# Patient Record
Sex: Female | Born: 1940 | Race: White | Hispanic: No | Marital: Married | Smoking: Never smoker
Health system: Southern US, Community
[De-identification: ages and names within clinical notes are randomized; demographics above are authoritative.]

## PROBLEM LIST (undated history)

## (undated) DIAGNOSIS — I2510 Atherosclerotic heart disease of native coronary artery without angina pectoris: Secondary | ICD-10-CM

## (undated) DIAGNOSIS — I4891 Unspecified atrial fibrillation: Secondary | ICD-10-CM

## (undated) DIAGNOSIS — R011 Cardiac murmur, unspecified: Secondary | ICD-10-CM

## (undated) DIAGNOSIS — J45909 Unspecified asthma, uncomplicated: Secondary | ICD-10-CM

## (undated) DIAGNOSIS — R2681 Unsteadiness on feet: Secondary | ICD-10-CM

## (undated) DIAGNOSIS — L309 Dermatitis, unspecified: Secondary | ICD-10-CM

## (undated) HISTORY — DX: Atherosclerotic heart disease of native coronary artery without angina pectoris: I25.10

## (undated) HISTORY — DX: Unspecified atrial fibrillation: I48.91

## (undated) HISTORY — DX: Dermatitis, unspecified: L30.9

## (undated) HISTORY — DX: Unsteadiness on feet: R26.81

## (undated) HISTORY — PX: OTHER SURGICAL HISTORY: SHX169

## (undated) HISTORY — PX: ABDOMINAL HYSTERECTOMY: SHX81

## (undated) HISTORY — DX: Unspecified asthma, uncomplicated: J45.909

## (undated) HISTORY — PX: RECTOCELE REPAIR: SHX761

## (undated) HISTORY — DX: Cardiac murmur, unspecified: R01.1

---

## 2001-02-01 ENCOUNTER — Other Ambulatory Visit: Admission: RE | Admit: 2001-02-01 | Discharge: 2001-02-01 | Payer: Self-pay | Admitting: Radiology

## 2001-10-11 DIAGNOSIS — R011 Cardiac murmur, unspecified: Secondary | ICD-10-CM

## 2001-10-11 HISTORY — DX: Cardiac murmur, unspecified: R01.1

## 2009-02-03 ENCOUNTER — Encounter: Admission: RE | Admit: 2009-02-03 | Discharge: 2009-02-03 | Payer: Self-pay | Admitting: Orthopedic Surgery

## 2012-11-11 ENCOUNTER — Encounter: Payer: Self-pay | Admitting: Pharmacist Clinician (PhC)/ Clinical Pharmacy Specialist

## 2012-11-11 DIAGNOSIS — Z7901 Long term (current) use of anticoagulants: Secondary | ICD-10-CM | POA: Insufficient documentation

## 2012-11-11 DIAGNOSIS — I4891 Unspecified atrial fibrillation: Secondary | ICD-10-CM

## 2012-11-21 ENCOUNTER — Other Ambulatory Visit (HOSPITAL_COMMUNITY): Payer: Self-pay | Admitting: Cardiovascular Disease

## 2012-11-21 DIAGNOSIS — R0989 Other specified symptoms and signs involving the circulatory and respiratory systems: Secondary | ICD-10-CM

## 2012-12-07 ENCOUNTER — Telehealth: Payer: Self-pay | Admitting: Cardiovascular Disease

## 2012-12-07 NOTE — Telephone Encounter (Signed)
Need refill on Ferrex 150mg  #30-call to [phone]!

## 2012-12-07 NOTE — Telephone Encounter (Signed)
LEFT MESSAGE ON PHARMACY REFILL LINE OKAY TO REFILL FERREX 6 TIMES.

## 2012-12-21 ENCOUNTER — Ambulatory Visit: Payer: Self-pay | Admitting: Pharmacist Clinician (PhC)/ Clinical Pharmacy Specialist

## 2012-12-21 ENCOUNTER — Ambulatory Visit (HOSPITAL_COMMUNITY)
Admission: RE | Admit: 2012-12-21 | Discharge: 2012-12-21 | Disposition: A | Discharge status: Home / Self Care | Payer: Medicare Other | Source: Ambulatory Visit | Attending: Cardiovascular Disease | Admitting: Cardiovascular Disease

## 2012-12-21 ENCOUNTER — Ambulatory Visit (INDEPENDENT_AMBULATORY_CARE_PROVIDER_SITE_OTHER): Payer: Medicare Other | Admitting: Pharmacist Clinician (PhC)/ Clinical Pharmacy Specialist

## 2012-12-21 DIAGNOSIS — I2510 Atherosclerotic heart disease of native coronary artery without angina pectoris: Secondary | ICD-10-CM | POA: Insufficient documentation

## 2012-12-21 DIAGNOSIS — R0989 Other specified symptoms and signs involving the circulatory and respiratory systems: Secondary | ICD-10-CM | POA: Insufficient documentation

## 2012-12-21 DIAGNOSIS — I4891 Unspecified atrial fibrillation: Secondary | ICD-10-CM

## 2012-12-21 DIAGNOSIS — Z7901 Long term (current) use of anticoagulants: Secondary | ICD-10-CM

## 2012-12-21 HISTORY — DX: Atherosclerotic heart disease of native coronary artery without angina pectoris: I25.10

## 2012-12-21 LAB — POCT INR: INR: 2.7

## 2012-12-21 NOTE — Progress Notes (Signed)
Carotid Duplex Completed. °Brianna L Mazza ° °

## 2013-01-23 ENCOUNTER — Ambulatory Visit (INDEPENDENT_AMBULATORY_CARE_PROVIDER_SITE_OTHER): Payer: Medicare Other | Admitting: Pharmacist Clinician (PhC)/ Clinical Pharmacy Specialist

## 2013-01-23 VITALS — BP 132/80 | HR 60

## 2013-01-23 DIAGNOSIS — I4891 Unspecified atrial fibrillation: Secondary | ICD-10-CM

## 2013-01-23 DIAGNOSIS — Z7901 Long term (current) use of anticoagulants: Secondary | ICD-10-CM

## 2013-01-23 LAB — POCT INR: INR: 3.4

## 2013-01-25 ENCOUNTER — Ambulatory Visit: Payer: Medicare Other | Admitting: Pharmacist Clinician (PhC)/ Clinical Pharmacy Specialist

## 2013-01-31 ENCOUNTER — Telehealth: Payer: Self-pay | Admitting: Cardiovascular Disease

## 2013-01-31 NOTE — Telephone Encounter (Signed)
Julie Kemp would like to speak with you about her coumadin .Marland Kitchen   Thanks

## 2013-01-31 NOTE — Telephone Encounter (Signed)
Had dental surgery yesterday, needed clarification on when to restart warfarin.  Advised pt to restart at normal dose today.

## 2013-02-15 ENCOUNTER — Ambulatory Visit (INDEPENDENT_AMBULATORY_CARE_PROVIDER_SITE_OTHER): Payer: Medicare Other | Admitting: Pharmacist Clinician (PhC)/ Clinical Pharmacy Specialist

## 2013-02-15 VITALS — BP 120/72 | HR 56

## 2013-02-15 DIAGNOSIS — Z7901 Long term (current) use of anticoagulants: Secondary | ICD-10-CM

## 2013-02-15 DIAGNOSIS — I4891 Unspecified atrial fibrillation: Secondary | ICD-10-CM

## 2013-02-15 LAB — POCT INR: INR: 3.1

## 2013-02-23 ENCOUNTER — Telehealth: Payer: Self-pay | Admitting: Cardiovascular Disease

## 2013-02-23 MED ORDER — SIMVASTATIN 40 MG PO TABS: 40.0000 mg | ORAL_TABLET | Freq: Every day | ORAL | Status: DC

## 2013-02-23 NOTE — Telephone Encounter (Signed)
Sent a refill request for Simvastatin 40mg  for a 90 day supply that was faxed over on 07/30 and 07/31 and they have not heard anything .Marland Kitchen Please call    Thanks

## 2013-03-01 ENCOUNTER — Other Ambulatory Visit: Payer: Self-pay | Admitting: *Deleted

## 2013-03-01 DIAGNOSIS — R5381 Other malaise: Secondary | ICD-10-CM

## 2013-03-01 DIAGNOSIS — R011 Cardiac murmur, unspecified: Secondary | ICD-10-CM

## 2013-03-01 DIAGNOSIS — R5383 Other fatigue: Secondary | ICD-10-CM

## 2013-03-01 DIAGNOSIS — R6889 Other general symptoms and signs: Secondary | ICD-10-CM

## 2013-03-01 LAB — CBC WITH DIFFERENTIAL/PLATELET
Basophils Relative: 0 % (ref 0–1)
Eosinophils Absolute: 0.1 10*3/uL (ref 0.0–0.7)
Hemoglobin: 11.7 g/dL — ABNORMAL LOW (ref 12.0–15.0)
Lymphs Abs: 1.8 10*3/uL (ref 0.7–4.0)
MCH: 30 pg (ref 26.0–34.0)
Neutro Abs: 2.9 10*3/uL (ref 1.7–7.7)
Neutrophils Relative %: 50 % (ref 43–77)
Platelets: 288 10*3/uL (ref 150–400)
RBC: 3.9 MIL/uL (ref 3.87–5.11)
WBC: 5.8 10*3/uL (ref 4.0–10.5)

## 2013-03-01 LAB — COMPLETE METABOLIC PANEL WITH GFR
ALT: 18 U/L (ref 0–35)
Albumin: 4 g/dL (ref 3.5–5.2)
Alkaline Phosphatase: 65 U/L (ref 39–117)
CO2: 29 mEq/L (ref 19–32)
Potassium: 4.8 mEq/L (ref 3.5–5.3)
Sodium: 137 mEq/L (ref 135–145)
Total Bilirubin: 0.4 mg/dL (ref 0.3–1.2)
Total Protein: 6.4 g/dL (ref 6.0–8.3)

## 2013-03-01 MED ORDER — SOTALOL HCL 80 MG PO TABS: 80.0000 mg | ORAL_TABLET | Freq: Two times a day (BID) | ORAL | Status: DC

## 2013-03-06 ENCOUNTER — Telehealth: Payer: Self-pay | Admitting: Cardiovascular Disease

## 2013-03-06 ENCOUNTER — Encounter: Payer: Self-pay | Admitting: Cardiovascular Disease

## 2013-03-06 NOTE — Telephone Encounter (Signed)
Would like lab results from last Thursday please.

## 2013-03-06 NOTE — Telephone Encounter (Signed)
Letter mailed out to pt

## 2013-03-06 NOTE — Telephone Encounter (Signed)
Message forwarded to J.C. Wildman, LPN.  

## 2013-03-08 ENCOUNTER — Ambulatory Visit (INDEPENDENT_AMBULATORY_CARE_PROVIDER_SITE_OTHER): Payer: Medicare Other | Admitting: Pharmacist Clinician (PhC)/ Clinical Pharmacy Specialist

## 2013-03-08 ENCOUNTER — Ambulatory Visit (HOSPITAL_COMMUNITY)
Admission: RE | Admit: 2013-03-08 | Discharge: 2013-03-08 | Disposition: A | Discharge status: Home / Self Care | Payer: Medicare Other | Source: Ambulatory Visit | Attending: Cardiovascular Disease | Admitting: Cardiovascular Disease

## 2013-03-08 VITALS — BP 128/76 | HR 72

## 2013-03-08 DIAGNOSIS — I517 Cardiomegaly: Secondary | ICD-10-CM | POA: Insufficient documentation

## 2013-03-08 DIAGNOSIS — R0609 Other forms of dyspnea: Secondary | ICD-10-CM | POA: Insufficient documentation

## 2013-03-08 DIAGNOSIS — R011 Cardiac murmur, unspecified: Secondary | ICD-10-CM

## 2013-03-08 DIAGNOSIS — I2510 Atherosclerotic heart disease of native coronary artery without angina pectoris: Secondary | ICD-10-CM

## 2013-03-08 DIAGNOSIS — I079 Rheumatic tricuspid valve disease, unspecified: Secondary | ICD-10-CM | POA: Insufficient documentation

## 2013-03-08 DIAGNOSIS — I4891 Unspecified atrial fibrillation: Secondary | ICD-10-CM | POA: Insufficient documentation

## 2013-03-08 DIAGNOSIS — I059 Rheumatic mitral valve disease, unspecified: Secondary | ICD-10-CM | POA: Insufficient documentation

## 2013-03-08 DIAGNOSIS — R0989 Other specified symptoms and signs involving the circulatory and respiratory systems: Secondary | ICD-10-CM | POA: Insufficient documentation

## 2013-03-08 DIAGNOSIS — Z7901 Long term (current) use of anticoagulants: Secondary | ICD-10-CM

## 2013-03-08 HISTORY — DX: Atherosclerotic heart disease of native coronary artery without angina pectoris: I25.10

## 2013-03-08 NOTE — Progress Notes (Signed)
2D Echo Performed 03/08/2013    Clearence Ped, RCS

## 2013-04-05 ENCOUNTER — Ambulatory Visit (INDEPENDENT_AMBULATORY_CARE_PROVIDER_SITE_OTHER): Payer: Medicare Other | Admitting: Pharmacist Clinician (PhC)/ Clinical Pharmacy Specialist

## 2013-04-05 ENCOUNTER — Ambulatory Visit: Payer: Medicare Other | Admitting: Pharmacist Clinician (PhC)/ Clinical Pharmacy Specialist

## 2013-04-05 VITALS — BP 130/80 | HR 68

## 2013-04-05 DIAGNOSIS — I4891 Unspecified atrial fibrillation: Secondary | ICD-10-CM

## 2013-04-05 DIAGNOSIS — Z7901 Long term (current) use of anticoagulants: Secondary | ICD-10-CM

## 2013-05-03 ENCOUNTER — Ambulatory Visit (INDEPENDENT_AMBULATORY_CARE_PROVIDER_SITE_OTHER): Payer: Medicare Other | Admitting: Pharmacist Clinician (PhC)/ Clinical Pharmacy Specialist

## 2013-05-03 VITALS — BP 132/90 | HR 72

## 2013-05-03 DIAGNOSIS — Z7901 Long term (current) use of anticoagulants: Secondary | ICD-10-CM

## 2013-05-03 DIAGNOSIS — I4891 Unspecified atrial fibrillation: Secondary | ICD-10-CM

## 2013-05-03 LAB — POCT INR: INR: 2.3

## 2013-05-21 ENCOUNTER — Other Ambulatory Visit: Payer: Self-pay | Admitting: Cardiology

## 2013-05-23 ENCOUNTER — Other Ambulatory Visit: Payer: Self-pay | Admitting: Pharmacist Clinician (PhC)/ Clinical Pharmacy Specialist

## 2013-05-24 ENCOUNTER — Telehealth: Payer: Self-pay | Admitting: Cardiovascular Disease

## 2013-05-24 NOTE — Telephone Encounter (Signed)
°  Wants to know if she can take motrin extra strength with the medication she is already taking.  Recently fell and hurt knee but nothing broken  Please call.

## 2013-05-25 NOTE — Telephone Encounter (Signed)
Nada Boozer, NP notified and advised pt should NOT take Motrin (IBU) r/t her taking coumadin.  Can take Extra Strength Tylenol.    Returned call and informed pt per instructions by NP.  Pt verbalized understanding and agreed w/ plan.  Pt asked if she can take Exedrin and informed she couldn't as it contains ASA which gives the same effect as Motrin.  Pt asked if she can take Tylenol PM and NP notified.  Advised she can and to be careful as it can cause falls r/t drowsiness.  Pt informed and verbalized understanding.  Pt advised to take Tylenol as directed on label and Tylenol PM at bedtime ONLY.  Pt also advised to take Tylenol PM for first time when she doesn't have to get up early to see how she handles it as she can be drowsy several hours after taking the Benadryl component.  Pt verbalized understanding and agreed w/ plan.

## 2013-06-11 ENCOUNTER — Ambulatory Visit (INDEPENDENT_AMBULATORY_CARE_PROVIDER_SITE_OTHER): Payer: Medicare Other | Admitting: Pharmacist Clinician (PhC)/ Clinical Pharmacy Specialist

## 2013-06-11 VITALS — BP 120/80 | HR 68

## 2013-06-11 DIAGNOSIS — Z7901 Long term (current) use of anticoagulants: Secondary | ICD-10-CM

## 2013-06-11 DIAGNOSIS — I4891 Unspecified atrial fibrillation: Secondary | ICD-10-CM

## 2013-06-11 LAB — POCT INR: INR: 2.3

## 2013-06-14 ENCOUNTER — Ambulatory Visit: Payer: Medicare Other | Admitting: Pharmacist Clinician (PhC)/ Clinical Pharmacy Specialist

## 2013-06-26 ENCOUNTER — Ambulatory Visit (INDEPENDENT_AMBULATORY_CARE_PROVIDER_SITE_OTHER): Payer: Medicare Other | Admitting: Cardiovascular Disease

## 2013-06-26 ENCOUNTER — Encounter: Payer: Self-pay | Admitting: Cardiovascular Disease

## 2013-06-26 VITALS — BP 110/70 | HR 60 | Ht 64.0 in | Wt 140.2 lb

## 2013-06-26 DIAGNOSIS — I340 Nonrheumatic mitral (valve) insufficiency: Secondary | ICD-10-CM

## 2013-06-26 DIAGNOSIS — I059 Rheumatic mitral valve disease, unspecified: Secondary | ICD-10-CM

## 2013-06-26 DIAGNOSIS — R42 Dizziness and giddiness: Secondary | ICD-10-CM

## 2013-06-26 DIAGNOSIS — I480 Paroxysmal atrial fibrillation: Secondary | ICD-10-CM

## 2013-06-26 DIAGNOSIS — I4891 Unspecified atrial fibrillation: Secondary | ICD-10-CM

## 2013-06-26 LAB — CBC
MCH: 29.6 pg (ref 26.0–34.0)
MCHC: 33.2 g/dL (ref 30.0–36.0)
Platelets: 284 10*3/uL (ref 150–400)
RDW: 13.6 % (ref 11.5–15.5)

## 2013-06-26 LAB — COMPREHENSIVE METABOLIC PANEL
ALT: 33 U/L (ref 0–35)
AST: 23 U/L (ref 0–37)
Albumin: 4.3 g/dL (ref 3.5–5.2)
Calcium: 9.9 mg/dL (ref 8.4–10.5)
Chloride: 101 mEq/L (ref 96–112)
Potassium: 4.7 mEq/L (ref 3.5–5.3)
Total Protein: 6.7 g/dL (ref 6.0–8.3)

## 2013-06-26 MED ORDER — SOTALOL HCL 120 MG PO TABS: 120.0000 mg | ORAL_TABLET | Freq: Two times a day (BID) | ORAL | Status: DC

## 2013-06-26 NOTE — Patient Instructions (Signed)
Your physician has recommended you make the following change in your medication: increase the sotolol to 120 mg twice daily.  Your physician recommends that you return for lab work.  Your physician recommends that you schedule a follow-up appointment in: 4 WEEKS.

## 2013-07-23 ENCOUNTER — Ambulatory Visit: Payer: Medicare Other | Admitting: Pharmacist Clinician (PhC)/ Clinical Pharmacy Specialist

## 2013-07-24 ENCOUNTER — Ambulatory Visit (INDEPENDENT_AMBULATORY_CARE_PROVIDER_SITE_OTHER): Payer: Medicare Other | Admitting: Pharmacist Clinician (PhC)/ Clinical Pharmacy Specialist

## 2013-07-24 ENCOUNTER — Encounter: Payer: Self-pay | Admitting: Cardiovascular Disease

## 2013-07-24 ENCOUNTER — Ambulatory Visit (INDEPENDENT_AMBULATORY_CARE_PROVIDER_SITE_OTHER): Payer: Medicare Other | Admitting: Cardiovascular Disease

## 2013-07-24 VITALS — BP 118/74 | HR 50 | Ht 64.0 in | Wt 139.4 lb

## 2013-07-24 DIAGNOSIS — R29818 Other symptoms and signs involving the nervous system: Secondary | ICD-10-CM

## 2013-07-24 DIAGNOSIS — I480 Paroxysmal atrial fibrillation: Secondary | ICD-10-CM

## 2013-07-24 DIAGNOSIS — I059 Rheumatic mitral valve disease, unspecified: Secondary | ICD-10-CM

## 2013-07-24 DIAGNOSIS — R2689 Other abnormalities of gait and mobility: Secondary | ICD-10-CM

## 2013-07-24 DIAGNOSIS — I4891 Unspecified atrial fibrillation: Secondary | ICD-10-CM

## 2013-07-24 DIAGNOSIS — I340 Nonrheumatic mitral (valve) insufficiency: Secondary | ICD-10-CM

## 2013-07-24 DIAGNOSIS — Z7901 Long term (current) use of anticoagulants: Secondary | ICD-10-CM

## 2013-07-24 LAB — POCT INR: INR: 3.1

## 2013-07-24 NOTE — Patient Instructions (Signed)
Your physician recommends that you schedule a follow-up 6 months with Dr Tresa Endo   FIRST AVAILABLE APPOINTMENT FOR [facility] FOR ASSESSMENT OF BALANCE ISSUES

## 2013-07-25 ENCOUNTER — Encounter: Payer: Self-pay | Admitting: Cardiovascular Disease

## 2013-07-25 DIAGNOSIS — I480 Paroxysmal atrial fibrillation: Secondary | ICD-10-CM | POA: Insufficient documentation

## 2013-07-25 DIAGNOSIS — R2689 Other abnormalities of gait and mobility: Secondary | ICD-10-CM | POA: Insufficient documentation

## 2013-07-25 DIAGNOSIS — I340 Nonrheumatic mitral (valve) insufficiency: Secondary | ICD-10-CM | POA: Insufficient documentation

## 2013-07-25 DIAGNOSIS — R42 Dizziness and giddiness: Secondary | ICD-10-CM | POA: Insufficient documentation

## 2013-07-25 NOTE — Progress Notes (Addendum)
Patient ID: KIERRIA FEIGENBAUM, female   DOB: 02/18/1941, 72 y.o.   MRN: [id number]     PATIENT PROFILE: Ms. Shauna Bodkins is a 72 year old history of sick sinus syndrome with paroxysmal atrial fibrillation. She's been followed by Dr. Alanda Amass who has since retired from the sole practice. She now presents to the office today to establish cardiology care with me.   HPI: Ms. Mell has a history of sick sinus syndrome with paroxysmal atrial fibrillation and his hat and has been on chronic sotalol therapy most recently at 80 mg at bedtime and 120 mg in the morning. She also has a history of anemia, and has a history of diverticular disease. She has been on chronic Coumadin therapy due to her atrial fibrillation. She underwent an echo Doppler study in August 2014 was interpreted by Dr. Alanda Amass and showed mild left ventricular hypertrophy with ejection fraction of 55-60% without regional wall motion abnormalities. She did have mitral and calcification with moderate mitral regurgitation directed centrally. She had mild-to-moderate left atrial dilatation and mild RA dilatation. In 2010 she underwent a nuclear perfusion study which showed fairly normal perfusion. In May 2014 she underwent carotid studies which were essentially normal.  Recently, she has noticed more palpitations. At times this  may last up to 30 seconds. She also has noted some shortness of breath. She has some difficulty with edema and anemia. She remains active but does not routinely exercise. She also has difficulty with balance.  History reviewed. No pertinent past medical history.  History reviewed. No pertinent past surgical history.  Allergies  Allergen Reactions  . Sulfur Nausea Only  . Zithromax [Azithromycin] Nausea And Vomiting    Current Outpatient Prescriptions  Medication Sig Dispense Refill  . Calcium Carbonate-Vit D-Min (CALCIUM 1200 PO) Take 1 tablet by mouth 2 (two) times daily.      . iron polysaccharides (NIFEREX)  150 MG capsule Take 150 mg by mouth daily.      Marland Kitchen omeprazole (PRILOSEC) 20 MG capsule Take 20 mg by mouth daily.      . simvastatin (ZOCOR) 40 MG tablet Take 1 tablet (40 mg total) by mouth at bedtime.  90 tablet  2  . temazepam (RESTORIL) 15 MG capsule Take 15 mg by mouth at bedtime as needed for sleep.      Marland Kitchen warfarin (COUMADIN) 5 MG tablet TAKE ONE TABLET BY MOUTH ONCE DAILY OR AS DIRECTED  90 tablet  0  . Zinc 50 MG CAPS Take 1 capsule by mouth daily.      . Misc Natural Products (FOCUSED MIND) CAPS Take 1 capsule by mouth 2 (two) times daily.      . sotalol (BETAPACE) 120 MG tablet Take 1 tablet (120 mg total) by mouth 2 (two) times daily.  60 tablet  6   No current facility-administered medications for this visit.    Social history is notable in that she is married and has 3 children 4 grandchildren. He is no history of tobacco use. She has worked Surveyor, minerals.  History reviewed. No pertinent family history.  ROS is negative for fever chills or night sweats. She denies headaches. She does wear glasses. She denies vertigo but does admit to difficulty with balance where she is off balance more than she is in balance. She denies awareness of increasing shortness of breath. She does note palpitations and recently these have increased. She denies chest pressure. She does have history of diverticular disease. She denies abdominal pain. She denies recent  GI bleeding or GU bleeding. She denies claudication symptoms. She denies significant edema. She did break her ankle approximately 10 years ago. She is unaware of sleep apnea symptoms. She denies restless legs. Other comprehensive 14 point system review is negative.  PE BP 110/70  Pulse 60  Ht 5\' 4"  (1.626 m)  Wt 140 lb 3.2 oz (63.594 kg)  BMI 24.05 kg/m2  Repeat blood pressure by me 124/76 supine; 120/76 standing General: Alert, oriented, no distress.  Skin: normal turgor, no rashes HEENT: Normocephalic, atraumatic. Pupils  round and reactive; sclera anicteric; no nystagmus; extraocular muscles are full Fundi  no AV nicking or arteriolar narrowing. No hemorrhages or exudates.  Nose without nasal septal hypertrophy Mouth/Parynx benign; Mallinpatti scale Neck: No JVD, no carotid bruits; normal carotid upstroke. No bisferens pulse Lungs: clear to ausculatation and percussion; no wheezing or rales Chest wall: without tenderness to palpitation Heart: RRR, s1 s2 normal 1-2/6 systolic murmur left sternal border to Abdomen: soft, nontender; no hepatosplenomehaly, BS+; abdominal aorta nontender and not dilated by palpation. Back: no CVA tenderness Pulses 2+ Extremities: no clubbing cyanosis or edema, Homan's sign negative  Neurologic: grossly nonfocal; Cranial nerves grossly wnl Psychologic: Normal mood and affect    ECG: Normal sinus rhythm at 60. PR interval 156 ms, QTc interval 432 ms. No significant ST-T changes.  LABS:  BMET    Component Value Date/Time   NA 138 06/26/2013 1133   K 4.7 06/26/2013 1133   CL 101 06/26/2013 1133   CO2 28 06/26/2013 1133   GLUCOSE 98 06/26/2013 1133   BUN 17 06/26/2013 1133   CREATININE 0.60 06/26/2013 1133   CALCIUM 9.9 06/26/2013 1133     Hepatic Function Panel     Component Value Date/Time   PROT 6.7 06/26/2013 1133   ALBUMIN 4.3 06/26/2013 1133   AST 23 06/26/2013 1133   ALT 33 06/26/2013 1133   ALKPHOS 82 06/26/2013 1133   BILITOT 0.5 06/26/2013 1133     CBC    Component Value Date/Time   WBC 6.6 06/26/2013 1133   RBC 4.19 06/26/2013 1133   HGB 12.4 06/26/2013 1133   HCT 37.4 06/26/2013 1133   PLT 284 06/26/2013 1133   MCV 89.3 06/26/2013 1133   MCH 29.6 06/26/2013 1133   MCHC 33.2 06/26/2013 1133   RDW 13.6 06/26/2013 1133   LYMPHSABS 1.8 03/01/2013 0953   MONOABS 0.9 03/01/2013 0953   EOSABS 0.1 03/01/2013 0953   BASOSABS 0.0 03/01/2013 0953     BNP No results found for this basename: probnp    Lipid Panel  No results found for this basename: chol, trig, hdl,  cholhdl, vldl, ldlcalc     RADIOLOGY: No results found.   ASSESSMENT AND PLAN: My impression is that Ms. Ozbun is a 72 year old female with history of sick sinus syndrome with paroxysmal atrial fibrillation for which she's been on chronic sotalol therapy. She continues to experience episodes of palpitations. She has been sotolol 120 mg in the morning and 80 mg at night. I recommended to further titrate her sotalol dose to 120 twice a day. She does have instances of dizziness. She does not have orthostatic hypotension presently with a blood pressure 124/76 supine 120/76 standing when taken by me. She had essentially normal carotid duplex scan in May and had normal vertebral blood flow to her cerebellum. I'm recommending laboratory be checked consisting of Cmet, magnesium level, CBC, iron studies.  I have reviewed recent laboratory done by Dr. Alanda Amass. I will see  her back in the office in 4 weeks for evaluation and further recommendations will be made at that time.   Lennette Bihari, MD, [facility] 07/25/2013 1:15 PM

## 2013-07-25 NOTE — Progress Notes (Signed)
Patient ID: Julie Kemp, female   DOB: Jan 27, 1941, 72 y.o.   MRN: [id number]      HPI: Julie Kemp  is a 72 year old is a former patient of Dr. Alanda Amass. I saw her one month ago when she established care with me for further evaluation of her sick sinus syndrome paroxysmal atrial fibrillation.   Julie Kemp has a history of sick sinus syndrome with paroxysmal atrial fibrillation and has been on chronic sotalol therapy. She also has a history of anemia, and has a history of diverticular disease. She has been on chronic Coumadin anticoagulation therapy. She underwent an echo Doppler study in August 2014 was interpreted by Dr. Alanda Amass and showed mild left ventricular hypertrophy with ejection fraction of 55-60% without regional wall motion abnormalities. She did have mitral annular calcification with moderate mitral regurgitation directed centrally. She had mild-to-moderate LA dilatation and mild RA dilatation. In 2010 she underwent a nuclear perfusion study which showed fairly normal perfusion. In May 2014 she underwent carotid studies which were essentially normal.  When I initially saw her, she was complaining that she was noticing more  palpitations that would often last up to 30 seconds. At that time, I recommended that she titrate her sotalol from 120 mg in the morning and 80 mg at night to 120 mg twice a day. Also obtain laboratories which revealed a normal magnesium level at 2.3. She had normal electrolytes history she's been documented by Dr. Alanda Amass to have normal thyroid function. Her hemoglobin was 12.4 hematocrit 37.4 She also has noted some shortness of breath. Since I last saw her her palpitations have improved on the increased dose of sotalol. However, her major complaint continues to be that her balance that is not right and most of the time she feels that she is more off balance and on balance. She denies change in vision. She denies weakness to one side of her body. She denies  paresthesias. She denies seizures. She denies nausea or vomiting.   No past surgical history on file.  Allergies  Allergen Reactions  . Sulfur Nausea Only  . Zithromax [Azithromycin] Nausea And Vomiting    Current Outpatient Prescriptions  Medication Sig Dispense Refill  . Calcium Carbonate-Vit D-Min (CALCIUM 1200 PO) Take 1 tablet by mouth 2 (two) times daily.      . iron polysaccharides (NIFEREX) 150 MG capsule Take 150 mg by mouth daily.      . Misc Natural Products (FOCUSED MIND) CAPS Take 1 capsule by mouth 2 (two) times daily.      Marland Kitchen omeprazole (PRILOSEC) 20 MG capsule Take 20 mg by mouth daily.      . simvastatin (ZOCOR) 40 MG tablet Take 1 tablet (40 mg total) by mouth at bedtime.  90 tablet  2  . sotalol (BETAPACE) 120 MG tablet Take 1 tablet (120 mg total) by mouth 2 (two) times daily.  60 tablet  6  . temazepam (RESTORIL) 15 MG capsule Take 15 mg by mouth at bedtime as needed for sleep.      Marland Kitchen warfarin (COUMADIN) 5 MG tablet TAKE ONE TABLET BY MOUTH ONCE DAILY OR AS DIRECTED  90 tablet  0  . Zinc 50 MG CAPS Take 1 capsule by mouth daily.       No current facility-administered medications for this visit.    Social history is notable in that she is married and has 3 children 4 grandchildren. He is no history of tobacco use. She has worked Surveyor, minerals.  No family history on file.  ROS is negative for fever chills or night sweats. She denies headaches. She does wear glasses. She denies vertigo but does admit to difficulty with balance where she is off balance more than she is in balance. She denies awareness of increasing shortness of breath. She does note palpitations and recently these have increased. She denies chest pressure. She does have history of diverticular disease. She denies abdominal pain. She denies recent GI bleeding or GU bleeding. She denies claudication symptoms. She denies significant edema. She did break her ankle approximately 10 years ago. She  is unaware of sleep apnea symptoms. She denies restless legs. Other comprehensive 14 point system review is negative.  PE BP 118/74  Pulse 50  Ht 5\' 4"  (1.626 m)  Wt 139 lb 6.4 oz (63.231 kg)  BMI 23.92 kg/m2  Blood pressure when taken by me was 128/76 supine and 120/75 standing General: Alert, oriented, no distress.  Skin: normal turgor, no rashes HEENT: Normocephalic, atraumatic. Pupils round and reactive; sclera anicteric; no nystagmus; extraocular muscles are full Fundi  no AV nicking or arteriolar narrowing. No hemorrhages or exudates.  Nose without nasal septal hypertrophy Mouth/Parynx benign; Mallinpatti scale 2/3 Neck: No JVD, no carotid bruits; normal carotid upstroke. No bisferens pulse Lungs: clear to ausculatation and percussion; no wheezing or rales Chest wall: without tenderness to palpitation Heart: RRR, s1 s2 normal 1-2/6 systolic murmur left sternal border  Abdomen: soft, nontender; no hepatosplenomehaly, BS+; abdominal aorta nontender and not dilated by palpation. Back: no CVA tenderness Pulses 2+ Extremities: no clubbing cyanosis or edema, Homan's sign negative  Neurologic: grossly nonfocal; Cranial nerves grossly wnl, Specifically, she had a fairly normal finger to nose test. She also had fairly normal rapid alternating movements. Psychologic: Normal mood and affect    ECG: Normal sinus rhythm at 50. PR interval 176 ms, QTc interval 430 ms. No significant ST-T changes.  LABS:  BMET    Component Value Date/Time   NA 138 06/26/2013 1133   K 4.7 06/26/2013 1133   CL 101 06/26/2013 1133   CO2 28 06/26/2013 1133   GLUCOSE 98 06/26/2013 1133   BUN 17 06/26/2013 1133   CREATININE 0.60 06/26/2013 1133   CALCIUM 9.9 06/26/2013 1133     Hepatic Function Panel     Component Value Date/Time   PROT 6.7 06/26/2013 1133   ALBUMIN 4.3 06/26/2013 1133   AST 23 06/26/2013 1133   ALT 33 06/26/2013 1133   ALKPHOS 82 06/26/2013 1133   BILITOT 0.5 06/26/2013 1133     CBC      Component Value Date/Time   WBC 6.6 06/26/2013 1133   RBC 4.19 06/26/2013 1133   HGB 12.4 06/26/2013 1133   HCT 37.4 06/26/2013 1133   PLT 284 06/26/2013 1133   MCV 89.3 06/26/2013 1133   MCH 29.6 06/26/2013 1133   MCHC 33.2 06/26/2013 1133   RDW 13.6 06/26/2013 1133   LYMPHSABS 1.8 03/01/2013 0953   MONOABS 0.9 03/01/2013 0953   EOSABS 0.1 03/01/2013 0953   BASOSABS 0.0 03/01/2013 0953     BNP No results found for this basename: probnp    Lipid Panel  No results found for this basename: chol,  trig,  hdl,  cholhdl,  vldl,  ldlcalc     RADIOLOGY: No results found.   ASSESSMENT AND PLAN: My impression is that Julie Kemp is a 72 year old female with history of sick sinus syndrome with paroxysmal atrial fibrillation for which she's been on  chronic sotalol therapy. Her palpitations have improved with a slight titration of her sotalol dose to 120 mg twice a day. She continues to have difficulty with balance. She had normal carotid Doppler studies with normal antegrade vertebral artery flow.  She did not have any obvious cerebellar findings on exam. However, because this seems to be her major issue I am recommending that she undergo neurologic evaluation for her balance issues and we'll refer her to [facility]. I did review her recent laboratory. There is no chest pain. I recommended she continue her current dose of sotalol 120 twice a day. I will see her in followup in 6 months for further evaluation.  Time spent: 25 min   Lennette Bihari, MD, [facility] 07/25/2013 1:32 PM

## 2013-08-03 ENCOUNTER — Other Ambulatory Visit: Payer: Self-pay | Admitting: *Deleted

## 2013-08-03 MED ORDER — POLYSACCHARIDE IRON COMPLEX 150 MG PO CAPS: 150.0000 mg | ORAL_CAPSULE | Freq: Every day | ORAL | Status: DC

## 2013-08-03 NOTE — Telephone Encounter (Signed)
Rx was sent to pharmacy electronically. 

## 2013-08-06 ENCOUNTER — Encounter (INDEPENDENT_AMBULATORY_CARE_PROVIDER_SITE_OTHER): Payer: Self-pay

## 2013-08-06 ENCOUNTER — Encounter: Payer: Self-pay | Admitting: Neurology

## 2013-08-06 ENCOUNTER — Ambulatory Visit (INDEPENDENT_AMBULATORY_CARE_PROVIDER_SITE_OTHER): Payer: Medicare HMO | Admitting: Neurology

## 2013-08-06 VITALS — BP 103/72 | HR 52 | Ht 64.0 in | Wt 137.5 lb

## 2013-08-06 DIAGNOSIS — E785 Hyperlipidemia, unspecified: Secondary | ICD-10-CM

## 2013-08-06 DIAGNOSIS — R29818 Other symptoms and signs involving the nervous system: Secondary | ICD-10-CM

## 2013-08-06 DIAGNOSIS — R2689 Other abnormalities of gait and mobility: Secondary | ICD-10-CM

## 2013-08-06 NOTE — Progress Notes (Addendum)
[facility]  PATIENT: Julie Kemp DOB: January 18, 1941  HISTORICAL Julie Kemp is a 73 years old right-handed Caucasian female, referred by her primary care physician Dr. Sherral Hammersobbins, and her cardiologist Dr. Nicholaus BloomKelley  for evaluation of unbalance.  She has past medical history of atrial fibrillation, on chronic Coumadin treatment, has been on chronic zinc supplement, during the winter months, 50 mg for 6 months each year. 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 Sulfur Nausea Only  . Zithromax [Azithromycin] Nausea And Vomiting  . Amoxapine And Related   . [facility][Shellfish Allergy]     HOME MEDICATIONS: Outpatient Prescriptions Prior to Visit  Medication Sig Dispense Refill  . Calcium Carbonate-Vit D-Min (CALCIUM 1200 PO) Take 1 tablet by mouth 2 (two) times daily.      . iron polysaccharides (NIFEREX) 150 MG capsule Take 1 capsule (150 mg total) by mouth daily.  30 capsule  11  . Misc Natural Products (FOCUSED MIND) CAPS Take 1 capsule by mouth 2 (two) times daily.      Marland Kitchen. omeprazole (PRILOSEC) 20 MG capsule Take 20 mg by mouth daily.      . simvastatin (ZOCOR) 40 MG tablet Take 1 tablet (40 mg total) by mouth at bedtime.  90 tablet  2  . temazepam  (RESTORIL) 15 MG capsule Take 15 mg by mouth at bedtime as needed for sleep.      Marland Kitchen. warfarin (COUMADIN) 5 MG tablet TAKE ONE TABLET BY MOUTH ONCE DAILY OR AS DIRECTED  90 tablet  0  . Zinc 50 MG CAPS Take 1 capsule by mouth daily. Half tablet at night.      . sotalol (BETAPACE) 120 MG tablet Take 1 tablet (120 mg total) by mouth 2 (two) times daily.  60 tablet  6     PAST MEDICAL HISTORY: Past Medical History  Diagnosis Date  . Atrial fibrillation   . Unsteady gait   . Asthma     PAST SURGICAL HISTORY: Past Surgical History  Procedure Laterality Date  . Abdominal hysterectomy      FAMILY HISTORY: Family History  Problem Relation Age of Onset  . Heart failure Mother   . Heart failure Father   . Diabetes Father     SOCIAL HISTORY:  History   Social History  . Marital Status: Widowed    Spouse Name: N/A    Number of Children: 3  . Years of Education: 9 th   Occupational History    Retired from sewing    Social History Main Topics  . Smoking status: Never Smoker   . Smokeless tobacco: Never Used  . Alcohol Use: No  . Drug Use: No  . Sexual Activity: Not on file   Other Topics Concern  . Not on file   Social History Narrative   Patient lives at home alone. Patient is widowed.  Retired.   Education 9th grade   Right handed.   Caffeine - None   PHYSICAL EXAM   Filed Vitals:   08/06/13 0950  BP: 103/72  Pulse: 52  Height: 5\' 4"  (1.626 m)  Weight: 137 lb 8 oz (62.37 kg)    Body mass index is 23.59 kg/(m^2).   Generalized: In no acute distress  Neck: Supple, no carotid bruits   Cardiac: Regular rate rhythm  Pulmonary: Clear to auscultation bilaterally  Musculoskeletal: No deformity  Neurological examination  Mentation: alert oriented to time, place, history taking, and causual conversation, she has moderate left turn, mild right tilt, frequent no-no shaking , mid hard of hearing.  Cranial nerve II-XII: Pupils were equal round reactive to  light extraocular movements were full, Visual field were full on confrontational test. Bilateral fundi were sharp.  Facial sensation were normal. Hearing was intact to finger rubbing bilaterally. Uvula tongue midline.  head turning and shoulder shrug and were normal and symmetric.Tongue protrusion into cheek strength was normal.  Motor: normal tone, bulk and strength.  Sensory: Intact to fine touch, pinprick, decreased vibratory sensation in her fingertips, and toes,, and preserved proprioception at toes.  Coordination: Normal finger to nose, heel-to-shin bilaterally there was no truncal ataxia  Gait: Mildly wide-based cautious gait, unsteady, has difficulty perform tiptoe, heel walking, she could not do tandem walking  Romberg signs: Positive  Deep tendon reflexes: Brachioradialis 2/2, biceps 2/2, triceps 2/2, patellar 2/2, Achilles 2/2, plantar responses were flexor bilaterally.   DIAGNOSTIC DATA (LABS, IMAGING, TESTING) - I reviewed patient records, labs, notes, testing and imaging myself where available.  Lab Results  Component Value Date   WBC 6.6 06/26/2013   HGB 12.4 06/26/2013   HCT 37.4 06/26/2013   MCV 89.3 06/26/2013   PLT 284 06/26/2013      Component Value Date/Time   NA 138 06/26/2013 1133   K 4.7 06/26/2013 1133   CL 101 06/26/2013 1133   CO2 28 06/26/2013 1133   GLUCOSE 98 06/26/2013 1133   BUN 17 06/26/2013 1133   CREATININE 0.60 06/26/2013 1133   CALCIUM 9.9 06/26/2013 1133   PROT 6.7 06/26/2013 1133   ALBUMIN 4.3 06/26/2013 1133   AST 23 06/26/2013 1133   ALT 33 06/26/2013 1133   ALKPHOS 82 06/26/2013 1133   BILITOT 0.5 06/26/2013 1133   ASSESSMENT AND PLAN   73 years old Caucasian female, with past medical history of atrial fibrillation, on chronic Coumadin treatment, mild anemia, she is also on chronic zinc supplement, she presenting with 5 years history of gradual onset balance issues, bilateral feet paresthesia. On examination, she has positive Romberg signs, mildly  wide-based cautious gait, Length dependent sensory changes    1 differentiation diagnosis including copper deficiency, vitamin B12 deficiency.      2 laboratory evaluation today, including copper level, ceruloplasmin level,  3. Physical therapy 4 return to clinic in 2 months. 5 she also has evidence of cervical dystonia, moderate left turn, mild right tilt, constant NO-NO shaking.      Levert Feinstein, M.D. Ph.D.  [facility] [address], [address] [place], [place] [address] [phone]

## 2013-08-07 ENCOUNTER — Encounter: Payer: Self-pay | Admitting: Cardiovascular Disease

## 2013-08-08 ENCOUNTER — Telehealth: Payer: Self-pay | Admitting: Neurology

## 2013-08-08 DIAGNOSIS — I4891 Unspecified atrial fibrillation: Secondary | ICD-10-CM

## 2013-08-08 DIAGNOSIS — R2689 Other abnormalities of gait and mobility: Secondary | ICD-10-CM

## 2013-08-08 DIAGNOSIS — R42 Dizziness and giddiness: Secondary | ICD-10-CM

## 2013-08-08 LAB — COPPER, SERUM: Copper: 113 ug/dL (ref 72–166)

## 2013-08-08 LAB — CERULOPLASMIN: Ceruloplasmin: 25.5 mg/dL (ref 16.0–45.0)

## 2013-08-08 LAB — THYROID PANEL WITH TSH
Free Thyroxine Index: 2.8 (ref 1.2–4.9)
T3 UPTAKE RATIO: 29 % (ref 24–39)
T4 TOTAL: 9.8 ug/dL (ref 4.5–12.0)
TSH: 1.11 u[IU]/mL (ref 0.450–4.500)

## 2013-08-08 LAB — METHYLMALONIC ACID, SERUM: Methylmalonic Acid: 236 nmol/L (ref 0–378)

## 2013-08-08 LAB — CK: Total CK: 51 U/L (ref 24–173)

## 2013-08-08 LAB — ANA W/REFLEX IF POSITIVE: Anti Nuclear Antibody(ANA): NEGATIVE

## 2013-08-08 LAB — FOLATE: FOLATE: 18.2 ng/mL (ref >3.0)

## 2013-08-08 LAB — VITAMIN B12: VITAMIN B 12: 437 pg/mL (ref 211–946)

## 2013-08-08 LAB — SEDIMENTATION RATE: Sed Rate: 6 mm/hr (ref 0–40)

## 2013-08-08 LAB — RPR: SYPHILIS RPR SCR: NONREACTIVE

## 2013-08-08 NOTE — Progress Notes (Signed)
Quick Note:  Will review result on follow up visit. ______

## 2013-08-08 NOTE — Telephone Encounter (Signed)
I have called her, lab showed normal, I will order MRI brain and cervical spine, keep follow up.

## 2013-08-14 ENCOUNTER — Encounter (INDEPENDENT_AMBULATORY_CARE_PROVIDER_SITE_OTHER): Payer: Self-pay

## 2013-08-14 ENCOUNTER — Ambulatory Visit (INDEPENDENT_AMBULATORY_CARE_PROVIDER_SITE_OTHER): Payer: Medicare HMO | Admitting: Neurology

## 2013-08-14 ENCOUNTER — Encounter (INDEPENDENT_AMBULATORY_CARE_PROVIDER_SITE_OTHER): Payer: Medicare HMO

## 2013-08-14 DIAGNOSIS — E785 Hyperlipidemia, unspecified: Secondary | ICD-10-CM

## 2013-08-14 DIAGNOSIS — R2689 Other abnormalities of gait and mobility: Secondary | ICD-10-CM

## 2013-08-14 DIAGNOSIS — G5600 Carpal tunnel syndrome, unspecified upper limb: Secondary | ICD-10-CM

## 2013-08-14 DIAGNOSIS — I4891 Unspecified atrial fibrillation: Secondary | ICD-10-CM

## 2013-08-14 NOTE — Procedures (Signed)
    [facility]  NCS (NERVE CONDUCTION STUDY) WITH EMG (ELECTROMYOGRAPHY) REPORT   STUDY DATE: Jan 20th 2015  PATIENT NAME: Julie CoveRuby H Erker DOB: [birth year] MRN: [id number]    TECHNOLOGIST: Gearldine ShownLorraine Jones ELECTROMYOGRAPHER: Levert FeinsteinYan, Jonasia Coiner M.D.  CLINICAL INFORMATION:  73 years old right-handed Caucasian female, with gradual onset gait difficulty since 2010, she denies significant pain, bilateral lower extremity weakness, no significant sensory loss,  On examination: She has wide-based cautious gait, she was able to stand on tiptoe, heels, could not perform tandem walking,  she has mild trunk ataxia positive Romberg signs, deep tendon reflexes of bilateral upper and lower extremities were brisk and symmetric, bilateral Babinski signs     FINDINGS: NERVE CONDUCTION STUDY: Bilateral peroneal sensory responses are normal. Bilateral peroneal to EDB, and tibial motor responses were normal. Bilateral tibial H. reflexes were normal and symmetric. Bilateral ulnar sensory and motor responses were normal. Bilateral median sensory response showed mildly prolonged peak latency, bilateral median motor response showed mildly prolonged distal latency, with normal C. map amplitude, conduction velocity  NEEDLE ELECTROMYOGRAPHY: Selected needle examination was performed at right lower extremity muscles, and right lumbosacral paraspinal muscles  Needle examination of right tibialis anterior,  tibialis posterior, medial gastrocnemius, vastus lateralis, peroneal longus, iliopsoas muscle was normal There was no spontaneous activity at right lumbosacral paraspinal muscles, right L4, L5, S1   IMPRESSION:   This is an abnormal study. 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 Ph.D. [facility] [address], [address] [place], [place]  [id number] [phone]

## 2013-08-23 ENCOUNTER — Ambulatory Visit
Admission: RE | Admit: 2013-08-23 | Discharge: 2013-08-23 | Disposition: A | Discharge status: Home / Self Care | Payer: Medicare HMO | Source: Ambulatory Visit | Attending: Neurology | Admitting: Neurology

## 2013-08-23 DIAGNOSIS — R2689 Other abnormalities of gait and mobility: Secondary | ICD-10-CM

## 2013-08-23 DIAGNOSIS — R42 Dizziness and giddiness: Secondary | ICD-10-CM

## 2013-08-23 DIAGNOSIS — R269 Unspecified abnormalities of gait and mobility: Secondary | ICD-10-CM

## 2013-08-23 DIAGNOSIS — I4891 Unspecified atrial fibrillation: Secondary | ICD-10-CM

## 2013-08-27 ENCOUNTER — Telehealth: Payer: Self-pay | Admitting: Neurology

## 2013-08-27 NOTE — Telephone Encounter (Signed)
Pt called and was asking what her MRI tests results are.  Please call.

## 2013-08-28 NOTE — Telephone Encounter (Signed)
Patient calling requesting MRI results. Please advise.

## 2013-08-29 NOTE — Telephone Encounter (Signed)
Patient's MRI results was sent to Dr. Terrace ArabiaYan for review.

## 2013-08-29 NOTE — Telephone Encounter (Signed)
I have called Julie Kemp, left message  MRI brain showed mild small vessel disease, age related changes, MRI cervical showed multiple level degenerative disc disease, no significant canal stenosis, now of above findings would explain her gait difficulty, she should keep up her followup appointment.

## 2013-08-29 NOTE — Telephone Encounter (Signed)
Pt calling again for MRI results. Please call

## 2013-09-06 ENCOUNTER — Ambulatory Visit (INDEPENDENT_AMBULATORY_CARE_PROVIDER_SITE_OTHER): Payer: Medicare HMO | Admitting: Pharmacist Clinician (PhC)/ Clinical Pharmacy Specialist

## 2013-09-06 VITALS — BP 112/72 | HR 72

## 2013-09-06 DIAGNOSIS — Z7901 Long term (current) use of anticoagulants: Secondary | ICD-10-CM

## 2013-09-06 DIAGNOSIS — I4891 Unspecified atrial fibrillation: Secondary | ICD-10-CM

## 2013-09-06 LAB — POCT INR: INR: 2.3

## 2013-10-08 ENCOUNTER — Ambulatory Visit (INDEPENDENT_AMBULATORY_CARE_PROVIDER_SITE_OTHER): Payer: Medicare HMO | Admitting: Neurology

## 2013-10-08 ENCOUNTER — Encounter: Payer: Self-pay | Admitting: Neurology

## 2013-10-08 VITALS — BP 128/67 | HR 57 | Ht 64.0 in | Wt 139.0 lb

## 2013-10-08 DIAGNOSIS — R2689 Other abnormalities of gait and mobility: Secondary | ICD-10-CM

## 2013-10-08 DIAGNOSIS — R29818 Other symptoms and signs involving the nervous system: Secondary | ICD-10-CM

## 2013-10-08 DIAGNOSIS — M545 Low back pain, unspecified: Secondary | ICD-10-CM

## 2013-10-08 NOTE — Progress Notes (Signed)
[facility]  PATIENT: Julie Kemp DOB: June 15, 1941  HISTORICAL [name] is a 73 years old right-handed Caucasian female, referred by her primary care physician Dr. Sherral Hammersobbins, and her cardiologist Dr. Nicholaus BloomKelley  for evaluation of unbalance.  She has past medical history of atrial fibrillation, on chronic Coumadin treatment, has been on chronic zinc supplement, during the winter months, 50 mg for 6 months each year. 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[employer], sometimes complains bilateral fingertips paresthesia, turning white in cold weather,  She notice head shaking, She denies neck pain, no significant low back pain, she has stress incontinence, no significant bilateral lower extremity weakness  UPDATE March 16th 2015:  She continued to have gait difficulty, only slight lower extremity paresthesia, she also complains of stress urinary incontinence, chronic constipation,  We have reviewed MRI of the brain, mild small vessel disease, MRI of cervical spine, mild multilevel degenerative disc disease, but there was no evidence of cord compression  Laboratory showed normal B12, copper level,  EMG nerve conduction study showed bilateral carpal tunnel syndrome, but no evidence of large fiber neuropathy.  REVIEW OF SYSTEMS: Full 14 system review of systems performed and notable only for runny nose, trouble swallowing, cough, shortness of breath, palpitation, urgency, back pain, walking difficulty, coordination problems  ALLERGIES: Allergies  Allergen Reactions  . Sulfur Nausea Only  . Zithromax [Azithromycin] Nausea And Vomiting  . Amoxapine And Related   . Clams [facility][Shellfish Allergy]     HOME  MEDICATIONS: Outpatient Prescriptions Prior to Visit  Medication Sig Dispense Refill  . Calcium Carbonate-Vit D-Min (CALCIUM 1200 PO) Take 1 tablet by mouth 2 (two) times daily.      . iron polysaccharides (NIFEREX) 150 MG capsule Take 1 capsule (150 mg total) by mouth daily.  30 capsule  11  . Misc Natural Products (FOCUSED MIND) CAPS Take 1 capsule by mouth 2 (two) times daily.      Marland Kitchen. omeprazole (PRILOSEC) 20 MG capsule Take 20 mg by mouth daily.      . simvastatin (ZOCOR) 40 MG tablet Take 1 tablet (40 mg total) by mouth at bedtime.  90 tablet  2  . temazepam (RESTORIL) 15 MG capsule Take 15 mg by mouth at bedtime as needed for sleep.      Marland Kitchen. warfarin (COUMADIN) 5 MG tablet TAKE ONE TABLET BY MOUTH ONCE DAILY OR AS DIRECTED  90 tablet  0  . Zinc 50 MG CAPS Take 1 capsule by mouth daily. Half tablet at night.      . sotalol (BETAPACE) 120 MG tablet Take 1 tablet (120 mg total) by mouth 2 (two) times daily.  60 tablet  6     PAST MEDICAL HISTORY: Past Medical History  Diagnosis Date  . Atrial fibrillation   . Unsteady gait   . Asthma     PAST SURGICAL HISTORY: Past Surgical History  Procedure Laterality Date  . Abdominal hysterectomy      FAMILY HISTORY: Family History  Problem Relation Age of Onset  . Heart failure Mother   . Heart failure Father   . Diabetes Father     SOCIAL HISTORY:  History   Social History  . Marital Status: Widowed    Spouse Name:  N/A    Number of Children: 3  . Years of Education: 9 th   Occupational History    Retired from sewing    Social History Main Topics  . Smoking status: Never Smoker   . Smokeless tobacco: Never Used  . Alcohol Use: No  . Drug Use: No  . Sexual Activity: Not on file   Other Topics Concern  . Not on file   Social History Narrative   Patient lives at home alone. Patient is widowed.   Retired.   Education 9th grade   Right handed.   Caffeine - None   PHYSICAL EXAM   Filed Vitals:   10/08/13 1157  BP:  128/67  Pulse: 57  Height: 5\' 4"  (1.626 m)  Weight: 139 lb (63.05 kg)    Body mass index is 23.85 kg/(m^2).   Generalized: In no acute distress  Neck: Supple, no carotid bruits   Cardiac: Regular rate rhythm  Pulmonary: Clear to auscultation bilaterally  Musculoskeletal: No deformity  Neurological examination  Mentation: alert oriented to time, place, history taking, and causual conversation, she has moderate left turn, mild right tilt, frequent no-no shaking , mid hard of hearing.  Cranial nerve II-XII: Pupils were equal round reactive to light extraocular movements were full, Visual field were full on confrontational test. Bilateral fundi were sharp.  Facial sensation were normal. Hearing was intact to finger rubbing bilaterally. Uvula tongue midline.  head turning and shoulder shrug and were normal and symmetric.Tongue protrusion into cheek strength was normal.  Motor: normal tone, bulk and strength.  Sensory: Intact to fine touch, pinprick, decreased vibratory sensation in her   toes, and preserved proprioception at toes.  Coordination: Normal finger to nose, heel-to-shin bilaterally there was no truncal ataxia  Gait: Mildly wide-based cautious gait, unsteady, has difficulty perform tiptoe, heel walking, she could not do tandem walking  Romberg signs: Positive  Deep tendon reflexes: Brachioradialis 2/2, biceps 2/2, triceps 2/2, patellar 2/2, Achilles absent, plantar responses were flexor bilaterally.   DIAGNOSTIC DATA (LABS, IMAGING, TESTING) - I reviewed patient records, labs, notes, testing and imaging myself where available.  Lab Results  Component Value Date   WBC 6.6 06/26/2013   HGB 12.4 06/26/2013   HCT 37.4 06/26/2013   MCV 89.3 06/26/2013   PLT 284 06/26/2013      Component Value Date/Time   NA 138 06/26/2013 1133   K 4.7 06/26/2013 1133   CL 101 06/26/2013 1133   CO2 28 06/26/2013 1133   GLUCOSE 98 06/26/2013 1133   BUN 17 06/26/2013 1133   CREATININE 0.60  06/26/2013 1133   CALCIUM 9.9 06/26/2013 1133   PROT 6.7 06/26/2013 1133   ALBUMIN 4.3 06/26/2013 1133   AST 23 06/26/2013 1133   ALT 33 06/26/2013 1133   ALKPHOS 82 06/26/2013 1133   BILITOT 0.5 06/26/2013 1133   ASSESSMENT AND PLAN   73 years old Caucasian female, with past medical history of atrial fibrillation, on chronic Coumadin treatment, mild anemia, with gradual onset gait difficulty, hyperreflexia of bilateral patellar reflexes, absent ankle reflex  1 need to rule out thoracic spine pathology, proceed with MRI of thoracic, lumbar spine 2 home physical therapy 3 return to clinic in 3 months. Levert Feinstein, M.D. Ph.D.  [facility] [address] [place], [place] [address] [phone]

## 2013-10-11 ENCOUNTER — Ambulatory Visit: Payer: Medicare HMO | Admitting: Neurology

## 2013-10-19 ENCOUNTER — Ambulatory Visit (INDEPENDENT_AMBULATORY_CARE_PROVIDER_SITE_OTHER): Payer: Commercial Managed Care - HMO | Admitting: Pharmacist Clinician (PhC)/ Clinical Pharmacy Specialist

## 2013-10-19 ENCOUNTER — Ambulatory Visit: Payer: Medicare HMO | Admitting: Pharmacist Clinician (PhC)/ Clinical Pharmacy Specialist

## 2013-10-19 DIAGNOSIS — I4891 Unspecified atrial fibrillation: Secondary | ICD-10-CM

## 2013-10-19 DIAGNOSIS — Z7901 Long term (current) use of anticoagulants: Secondary | ICD-10-CM

## 2013-10-19 LAB — POCT INR: INR: 1.7

## 2013-10-22 ENCOUNTER — Ambulatory Visit: Payer: Medicare HMO | Admitting: Pharmacist Clinician (PhC)/ Clinical Pharmacy Specialist

## 2013-11-14 ENCOUNTER — Other Ambulatory Visit: Payer: Self-pay | Admitting: Pharmacist Clinician (PhC)/ Clinical Pharmacy Specialist

## 2013-11-14 MED ORDER — WARFARIN SODIUM 5 MG PO TABS: ORAL_TABLET | ORAL | Status: DC

## 2013-11-16 ENCOUNTER — Ambulatory Visit (INDEPENDENT_AMBULATORY_CARE_PROVIDER_SITE_OTHER): Payer: Medicare HMO | Admitting: Pharmacist Clinician (PhC)/ Clinical Pharmacy Specialist

## 2013-11-16 DIAGNOSIS — I4891 Unspecified atrial fibrillation: Secondary | ICD-10-CM

## 2013-11-16 DIAGNOSIS — Z7901 Long term (current) use of anticoagulants: Secondary | ICD-10-CM

## 2013-11-16 LAB — POCT INR: INR: 2

## 2013-11-20 ENCOUNTER — Encounter: Payer: Self-pay | Admitting: Neurology

## 2013-11-27 ENCOUNTER — Other Ambulatory Visit: Payer: Self-pay | Admitting: *Deleted

## 2013-11-27 MED ORDER — SIMVASTATIN 40 MG PO TABS: 40.0000 mg | ORAL_TABLET | Freq: Every day | ORAL | Status: DC

## 2013-11-27 NOTE — Telephone Encounter (Signed)
Rx refill sent to patient pharmacy   

## 2013-12-03 ENCOUNTER — Telehealth: Payer: Self-pay | Admitting: Cardiovascular Disease

## 2013-12-03 MED ORDER — SIMVASTATIN 40 MG PO TABS: 40.0000 mg | ORAL_TABLET | Freq: Every day | ORAL | Status: DC

## 2013-12-03 NOTE — Telephone Encounter (Signed)
Rx was sent to pharmacy electronically. 

## 2013-12-03 NOTE — Telephone Encounter (Signed)
Need a refill on her Simvastatin 40 mg #90

## 2013-12-07 ENCOUNTER — Telehealth: Payer: Self-pay | Admitting: Cardiovascular Disease

## 2013-12-07 MED ORDER — SIMVASTATIN 40 MG PO TABS: 40.0000 mg | ORAL_TABLET | Freq: Every day | ORAL | Status: DC

## 2013-12-07 NOTE — Telephone Encounter (Signed)
Pt still have not gotten her simvastatin,she is completely out. Please call this asap to [facility].Pt did not have the phone number.

## 2013-12-19 ENCOUNTER — Ambulatory Visit: Payer: Medicare HMO | Admitting: Pharmacist Clinician (PhC)/ Clinical Pharmacy Specialist

## 2013-12-21 ENCOUNTER — Ambulatory Visit: Payer: Medicare HMO | Admitting: Pharmacist Clinician (PhC)/ Clinical Pharmacy Specialist

## 2013-12-21 ENCOUNTER — Ambulatory Visit (INDEPENDENT_AMBULATORY_CARE_PROVIDER_SITE_OTHER): Payer: Commercial Managed Care - HMO | Admitting: Pharmacist Clinician (PhC)/ Clinical Pharmacy Specialist

## 2013-12-21 DIAGNOSIS — I4891 Unspecified atrial fibrillation: Secondary | ICD-10-CM | POA: Diagnosis not present

## 2013-12-21 DIAGNOSIS — Z7901 Long term (current) use of anticoagulants: Secondary | ICD-10-CM | POA: Diagnosis not present

## 2013-12-21 LAB — POCT INR: INR: 1.2

## 2013-12-31 ENCOUNTER — Ambulatory Visit (INDEPENDENT_AMBULATORY_CARE_PROVIDER_SITE_OTHER): Payer: Commercial Managed Care - HMO | Admitting: Pharmacist Clinician (PhC)/ Clinical Pharmacy Specialist

## 2013-12-31 DIAGNOSIS — I4891 Unspecified atrial fibrillation: Secondary | ICD-10-CM

## 2013-12-31 DIAGNOSIS — Z7901 Long term (current) use of anticoagulants: Secondary | ICD-10-CM | POA: Diagnosis not present

## 2013-12-31 LAB — POCT INR: INR: 1.2

## 2014-01-14 ENCOUNTER — Encounter: Payer: Self-pay | Admitting: Neurology

## 2014-01-14 ENCOUNTER — Ambulatory Visit (INDEPENDENT_AMBULATORY_CARE_PROVIDER_SITE_OTHER): Payer: Commercial Managed Care - HMO | Admitting: Pharmacist Clinician (PhC)/ Clinical Pharmacy Specialist

## 2014-01-14 ENCOUNTER — Ambulatory Visit (INDEPENDENT_AMBULATORY_CARE_PROVIDER_SITE_OTHER): Payer: Medicare HMO | Admitting: Neurology

## 2014-01-14 VITALS — Wt 145.0 lb

## 2014-01-14 VITALS — BP 122/76 | HR 54 | Ht 64.0 in | Wt 144.0 lb

## 2014-01-14 DIAGNOSIS — M545 Low back pain, unspecified: Secondary | ICD-10-CM

## 2014-01-14 DIAGNOSIS — E785 Hyperlipidemia, unspecified: Secondary | ICD-10-CM

## 2014-01-14 DIAGNOSIS — Z7901 Long term (current) use of anticoagulants: Secondary | ICD-10-CM

## 2014-01-14 DIAGNOSIS — R2689 Other abnormalities of gait and mobility: Secondary | ICD-10-CM

## 2014-01-14 DIAGNOSIS — R42 Dizziness and giddiness: Secondary | ICD-10-CM

## 2014-01-14 DIAGNOSIS — I4891 Unspecified atrial fibrillation: Secondary | ICD-10-CM

## 2014-01-14 DIAGNOSIS — R29818 Other symptoms and signs involving the nervous system: Secondary | ICD-10-CM

## 2014-01-14 LAB — POCT INR: INR: 2.1

## 2014-01-14 NOTE — Progress Notes (Signed)
[facility]  PATIENT: Julie Kemp DOB: November 20, 1940  HISTORICAL Julie Kemp is a 73 years old right-handed Caucasian female, referred by her primary care physician Dr. Sherral Hammersobbins, and her cardiologist Dr. Nicholaus BloomKelley  for evaluation of unbalance.  She has past medical history of atrial fibrillation, on chronic Coumadin treatment, has been on chronic zinc supplement, during the winter months, 50 mg for 6 months each year. 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[employer], sometimes complains bilateral fingertips paresthesia, turning white in cold weather,  She notice head shaking, She denies neck pain, no significant low back pain, she has stress incontinence, no significant bilateral lower extremity weakness  UPDATE March 16th 2015:  She continued to have gait difficulty, only slight lower extremity paresthesia, she also complains of stress urinary incontinence, chronic constipation,  We have reviewed MRI of the brain, mild small vessel disease, significant cerebellum atrophy, MRI of cervical spine, mild multilevel degenerative disc disease, but there was no evidence of cord compression  Laboratory showed normal B12, copper level,  EMG nerve conduction study showed bilateral carpal tunnel syndrome, but no evidence of large fiber neuropathy.  UPDATE June 22nd 2015:  She continued to have gradual worsening gait difficulty, also complains of lightheaded when standing up, we have revealed MRI of thoracic spine, mild degenerative disc disease, no significant canal stenosis, MRI of lumbar spine, which also demonstrate multilevel degenerative disc disease, with multiple level  mild foraminal stenosis,  MRI of  the brain, did show significant cerebellum atrophy, which is inconsistent with her complains of gait difficulty, trunk ataxia,  There was no family history of similar problem," my siblings are older than me, but they gets around better"  REVIEW OF SYSTEMS: Full 14 system review of systems performed and notable only for runny nose, trouble swallowing, cough, shortness of breath, palpitation, urgency, back pain, walking difficulty, coordination problems  ALLERGIES: Allergies  Allergen Reactions  . Sulfur Nausea Only  . Zithromax [Azithromycin] Nausea And Vomiting  . Amoxapine And Related     HOME MEDICATIONS: Outpatient Prescriptions Prior to Visit  Medication Sig Dispense Refill  . Calcium Carbonate-Vit D-Min (CALCIUM 1200 PO) Take 1 tablet by mouth 2 (two) times daily.      . iron polysaccharides (NIFEREX) 150 MG capsule Take 1 capsule (150 mg total) by mouth daily.  30 capsule  11  . Misc Natural Products (FOCUSED MIND) CAPS Take 1 capsule by mouth 2 (two) times daily.      Marland Kitchen. omeprazole (PRILOSEC) 20 MG capsule Take 20 mg by mouth daily.      . simvastatin (ZOCOR) 40 MG tablet Take 1 tablet (40 mg total) by mouth at bedtime.  90 tablet  2  . temazepam (RESTORIL) 15 MG capsule Take 15 mg by mouth at bedtime as needed for sleep.      Marland Kitchen. warfarin (COUMADIN) 5 MG tablet TAKE ONE TABLET BY MOUTH ONCE DAILY OR AS DIRECTED  90 tablet  0  . Zinc 50 MG CAPS Take 1 capsule by mouth daily. Half tablet at night.      . sotalol (BETAPACE) 120 MG tablet Take 1 tablet (120 mg total) by mouth 2 (two) times daily.  60 tablet  6  PAST MEDICAL HISTORY: Past Medical History  Diagnosis Date  . Atrial fibrillation   . Unsteady gait   . Asthma     PAST SURGICAL HISTORY: Past Surgical History  Procedure Laterality Date  . Abdominal hysterectomy      FAMILY HISTORY: Family History  Problem Relation Age of Onset  . Heart failure Mother   . Heart failure Father   . Diabetes Father      SOCIAL HISTORY:  History   Social History  . Marital Status: Widowed    Spouse Name: N/A    Number of Children: 3  . Years of Education: 9 th   Occupational History    Retired from sewing    Social History Main Topics  . Smoking status: Never Smoker   . Smokeless tobacco: Never Used  . Alcohol Use: No  . Drug Use: No  . Sexual Activity: Not on file   Other Topics Concern  . Not on file   Social History Narrative   Patient lives at home alone. Patient is widowed.   Retired.   Education 9th grade   Right handed.   Caffeine - None   PHYSICAL EXAM   Filed Vitals:   01/14/14 1102  BP: 122/76  Pulse: 54  Height: 5\' 4"  (1.626 m)  Weight: 144 lb (65.318 kg)    Body mass index is 24.71 kg/(m^2).   Generalized: In no acute distress  Neck: Supple, no carotid bruits   Cardiac: Regular rate rhythm  Pulmonary: Clear to auscultation bilaterally  Musculoskeletal: No deformity  Neurological examination  Mentation: alert oriented to time, place, history taking, and causual conversation, she has moderate left turn, mild right tilt, frequent no-no shaking , mid hard of hearing.  Cranial nerve II-XII: Pupils were equal round reactive to light extraocular movements were full, Visual field were full on confrontational test. Bilateral fundi were sharp.  Facial sensation were normal. Hearing was intact to finger rubbing bilaterally. Uvula tongue midline.  head turning and shoulder shrug and were normal and symmetric.Tongue protrusion into cheek strength was normal.  Motor: normal tone, bulk and strength.  Sensory: Intact to fine touch, pinprick, decreased vibratory sensation in her   toes, and preserved proprioception at toes.  Coordination: Normal finger to nose, heel-to-shin bilaterally, she has mild to moderate trunk ataxia,  Gait: Mildly wide-based cautious gait, unsteady, has difficulty perform tiptoe, heel walking, she could not do tandem walking  Romberg signs:  Positive  Deep tendon reflexes: Brachioradialis 2/2, biceps 2/2, triceps 2/2, patellar 2/2, Achilles absent, plantar responses were flexor bilaterally.   DIAGNOSTIC DATA (LABS, IMAGING, TESTING) - I reviewed patient records, labs, notes, testing and imaging myself where available.  Lab Results  Component Value Date   WBC 6.6 06/26/2013   HGB 12.4 06/26/2013   HCT 37.4 06/26/2013   MCV 89.3 06/26/2013   PLT 284 06/26/2013      Component Value Date/Time   NA 138 06/26/2013 1133   K 4.7 06/26/2013 1133   CL 101 06/26/2013 1133   CO2 28 06/26/2013 1133   GLUCOSE 98 06/26/2013 1133   BUN 17 06/26/2013 1133   CREATININE 0.60 06/26/2013 1133   CALCIUM 9.9 06/26/2013 1133   PROT 6.7 06/26/2013 1133   ALBUMIN 4.3 06/26/2013 1133   AST 23 06/26/2013 1133   ALT 33 06/26/2013 1133   ALKPHOS 82 06/26/2013 1133   BILITOT 0.5 06/26/2013 1133   GFRNONAA 89 03/01/2013 0950   GFRAA >89 03/01/2013 0950   ASSESSMENT AND  PLAN   73 years old Caucasian female, with past medical history of atrial fibrillation, on chronic Coumadin treatment, mild anemia, with gradual onset gait difficulty, hyperreflexia of bilateral patellar reflexes, absent ankle reflex, trunk ataxia, MRI of the brain showed significant cerebellum atrophy, she also complains of lightheadedness,  Most consistent with multisystem atrophy, cerebellar variations, will refer her to [facility] for second opinion, return to clinic in 6-9 months,  Home physical therapy  Levert Feinstein, M.D. Ph.D.  [facility] [address] [place], [place] [address] [phone]

## 2014-02-04 ENCOUNTER — Ambulatory Visit (INDEPENDENT_AMBULATORY_CARE_PROVIDER_SITE_OTHER): Payer: Commercial Managed Care - HMO | Admitting: Pharmacist

## 2014-02-04 DIAGNOSIS — I4891 Unspecified atrial fibrillation: Secondary | ICD-10-CM

## 2014-02-04 DIAGNOSIS — Z7901 Long term (current) use of anticoagulants: Secondary | ICD-10-CM

## 2014-02-04 LAB — POCT INR: INR: 1.8

## 2014-02-19 ENCOUNTER — Other Ambulatory Visit: Payer: Self-pay | Admitting: *Deleted

## 2014-02-19 MED ORDER — OMEPRAZOLE 20 MG PO CPDR: 20.0000 mg | DELAYED_RELEASE_CAPSULE | Freq: Every day | ORAL | Status: DC

## 2014-02-20 ENCOUNTER — Other Ambulatory Visit: Payer: Self-pay

## 2014-02-20 MED ORDER — OMEPRAZOLE 20 MG PO CPDR: 20.0000 mg | DELAYED_RELEASE_CAPSULE | Freq: Every day | ORAL | Status: DC

## 2014-02-20 NOTE — Telephone Encounter (Signed)
Rx was sent to pharmacy electronically. 

## 2014-03-04 ENCOUNTER — Ambulatory Visit (INDEPENDENT_AMBULATORY_CARE_PROVIDER_SITE_OTHER): Payer: Commercial Managed Care - HMO | Admitting: Pharmacist Clinician (PhC)/ Clinical Pharmacy Specialist

## 2014-03-04 DIAGNOSIS — I4891 Unspecified atrial fibrillation: Secondary | ICD-10-CM

## 2014-03-04 DIAGNOSIS — Z7901 Long term (current) use of anticoagulants: Secondary | ICD-10-CM

## 2014-03-04 LAB — POCT INR: INR: 2.2

## 2014-04-03 ENCOUNTER — Ambulatory Visit: Payer: Commercial Managed Care - HMO | Admitting: Pharmacist Clinician (PhC)/ Clinical Pharmacy Specialist

## 2014-04-05 ENCOUNTER — Ambulatory Visit (INDEPENDENT_AMBULATORY_CARE_PROVIDER_SITE_OTHER): Payer: Commercial Managed Care - HMO | Admitting: Pharmacist Clinician (PhC)/ Clinical Pharmacy Specialist

## 2014-04-05 DIAGNOSIS — Z7901 Long term (current) use of anticoagulants: Secondary | ICD-10-CM

## 2014-04-05 DIAGNOSIS — I4891 Unspecified atrial fibrillation: Secondary | ICD-10-CM

## 2014-04-15 ENCOUNTER — Other Ambulatory Visit: Payer: Self-pay | Admitting: Pharmacist Clinician (PhC)/ Clinical Pharmacy Specialist

## 2014-04-15 MED ORDER — TEMAZEPAM 15 MG PO CAPS: 15.0000 mg | ORAL_CAPSULE | Freq: Every evening | ORAL | Status: DC | PRN

## 2014-04-15 NOTE — Telephone Encounter (Signed)
Reviewed with Dr. Tresa Endo.  Pt last filled Rx in January 2015 per pharmacy.  OK #10 no refills

## 2014-04-19 ENCOUNTER — Other Ambulatory Visit: Payer: Self-pay | Admitting: Pharmacist Clinician (PhC)/ Clinical Pharmacy Specialist

## 2014-04-19 MED ORDER — TEMAZEPAM 15 MG PO CAPS: 15.0000 mg | ORAL_CAPSULE | Freq: Every evening | ORAL | Status: DC | PRN

## 2014-04-19 NOTE — Telephone Encounter (Signed)
Ok #10 per TK

## 2014-04-26 ENCOUNTER — Other Ambulatory Visit: Payer: Self-pay

## 2014-04-26 MED ORDER — SOTALOL HCL 120 MG PO TABS: 120.0000 mg | ORAL_TABLET | Freq: Two times a day (BID) | ORAL | Status: DC

## 2014-04-26 NOTE — Telephone Encounter (Signed)
Rx sent to pharmacy   

## 2014-05-01 ENCOUNTER — Ambulatory Visit: Payer: Commercial Managed Care - HMO | Admitting: Cardiovascular Disease

## 2014-05-01 ENCOUNTER — Ambulatory Visit: Payer: Commercial Managed Care - HMO | Admitting: Pharmacist Clinician (PhC)/ Clinical Pharmacy Specialist

## 2014-05-03 ENCOUNTER — Encounter: Payer: Self-pay | Admitting: Cardiovascular Disease

## 2014-05-03 ENCOUNTER — Ambulatory Visit (INDEPENDENT_AMBULATORY_CARE_PROVIDER_SITE_OTHER): Payer: Commercial Managed Care - HMO | Admitting: Cardiovascular Disease

## 2014-05-03 ENCOUNTER — Ambulatory Visit (INDEPENDENT_AMBULATORY_CARE_PROVIDER_SITE_OTHER): Payer: Commercial Managed Care - HMO | Admitting: Pharmacist Clinician (PhC)/ Clinical Pharmacy Specialist

## 2014-05-03 VITALS — BP 110/70 | HR 55 | Ht 63.0 in | Wt 145.0 lb

## 2014-05-03 DIAGNOSIS — I480 Paroxysmal atrial fibrillation: Secondary | ICD-10-CM

## 2014-05-03 DIAGNOSIS — I4891 Unspecified atrial fibrillation: Secondary | ICD-10-CM

## 2014-05-03 DIAGNOSIS — Z7901 Long term (current) use of anticoagulants: Secondary | ICD-10-CM

## 2014-05-03 DIAGNOSIS — E782 Mixed hyperlipidemia: Secondary | ICD-10-CM

## 2014-05-03 DIAGNOSIS — I340 Nonrheumatic mitral (valve) insufficiency: Secondary | ICD-10-CM

## 2014-05-03 DIAGNOSIS — K219 Gastro-esophageal reflux disease without esophagitis: Secondary | ICD-10-CM

## 2014-05-03 LAB — POCT INR: INR: 2

## 2014-05-03 MED ORDER — PANTOPRAZOLE SODIUM 40 MG PO TBEC: 40.0000 mg | DELAYED_RELEASE_TABLET | Freq: Every day | ORAL | Status: DC

## 2014-05-03 NOTE — Patient Instructions (Signed)
Your physician has requested that you have a lexiscan myoview. For further information please visit [email or web address]. Please follow instruction sheet, as given.  Your physician recommends that you return for lab work fasting.  Your physician has recommended you make the following change in your medication: STOP THE OMEPRAZOLE AND START NEW PRESCRIPTION FOR PANTOPRAZOLE. This has already been sent to your pharmacy.  Your physician recommends that you schedule a follow-up appointment in: 6-8 weeks.

## 2014-05-03 NOTE — Progress Notes (Signed)
Patient ID: Julie Kemp, female   DOB: January 05, 1941, 73 y.o.   MRN: [id number]      HPI: Julie Kemp  is a 73 year old is a former patient of Dr. Alanda AmassWeintraub. She established care with me after his retirement.  She presents today for a 10 month follow-up cardiology evaluation,  Julie Kemp has a history of sick sinus syndrome with paroxysmal atrial fibrillation and has been on chronic sotalol therapy. She also has a history of anemia and  diverticular disease. She has been on chronic Coumadin anticoagulation therapy. An echo Doppler study in August 2014 showed mild left ventricular hypertrophy with ejection fraction of 55-60% without regional wall motion abnormalities. She did have mitral annular calcification with moderate mitral regurgitation directed centrally. She had mild-to-moderate LA dilatation and mild RA dilatation. In 2010 she underwent a nuclear perfusion study which showed fairly normal perfusion. In May 2014 she underwent carotid studies which were essentially normal.  When I initially saw her, she was complaining that she was noticing more  palpitations that would often last up to 30 seconds. At that time, I recommended that she titrate her sotalol from 120 mg in the morning and 80 mg at night to 120 mg twice a day. Laboratory demonstrated a normal magnesium level at 2.3. She had normal electrolytes and previously had normal thyroid function. Her hemoglobin was 12.4 hematocrit 37.4 She also has noted some shortness of breath.  She has tolerated the increased dose of sotalol at 120 mg twice a day without recurrent atrial fibrillation.  She does have a history of GERD and has been on chronic omeprazole 20 mg.  Recently she's noticed increasing heartburn symptoms.  There is mild shortness of breath with activity.  Her last stress test was 5-1/2 years ago.  Her INR was checked today and this was 2.0.  Her current dose of warfarin.  She denies any recent bleeding.   Past Surgical History    Procedure Laterality Date  . Abdominal hysterectomy    . Rectocele repair    . Lymph nodes      left groin lymph node biopsies for benign reasons  . Left femoral hernia repair      Allergies  Allergen Reactions  . Sulfur Nausea Only  . Zithromax [Azithromycin] Nausea And Vomiting  . Amoxapine And Related     Current Outpatient Prescriptions  Medication Sig Dispense Refill  . Calcium Carbonate-Vit D-Min (CALCIUM 1200 PO) Take 1 tablet by mouth 2 (two) times daily.      . chlorhexidine (PERIDEX) 0.12 % solution Rinse as directed      . docusate sodium (COLACE) 100 MG capsule Take 100 mg by mouth 2 (two) times daily. 1 capsule each morning and 2 capsules each evening      . HYDROcodone-acetaminophen (NORCO/VICODIN) 5-325 MG per tablet Take 1 tablet by mouth as needed.      . iron polysaccharides (NIFEREX) 150 MG capsule Take 1 capsule (150 mg total) by mouth daily.  30 capsule  11  . omeprazole (PRILOSEC) 20 MG capsule Take 1 capsule (20 mg total) by mouth daily.  90 capsule  1  . psyllium (METAMUCIL) 58.6 % packet Take 1 packet by mouth daily.      . simvastatin (ZOCOR) 40 MG tablet Take 1 tablet (40 mg total) by mouth at bedtime.  90 tablet  2  . sotalol (BETAPACE) 120 MG tablet Take 1 tablet (120 mg total) by mouth 2 (two) times daily. One in a half Am  and one half at night.  60 tablet  2  . temazepam (RESTORIL) 15 MG capsule Take 1 capsule (15 mg total) by mouth at bedtime as needed for sleep.  10 capsule  0  . warfarin (COUMADIN) 5 MG tablet Take 1 tablet by mouth daily or as directed by coumadin clinic  90 tablet  1   No current facility-administered medications for this visit.    Social history is notable in that she is married and has 3 children 4 grandchildren. He is no history of tobacco use. She has worked Surveyor, mineralspart-time making handbags.  Family History  Problem Relation Age of Onset  . Heart failure Mother   . Heart failure Father   . Diabetes Father    ROS General:  Negative; No fevers, chills, or night sweats;  HEENT: Negative; No changes in vision or hearing, sinus congestion, difficulty swallowing Pulmonary: Negative; No cough, wheezing, shortness of breath, hemoptysis Cardiovascular: Negative; No chest pain, presyncope, syncope, palpitations GI: Positive for GERD and diverticular disease; No nausea, vomiting, diarrhea, or abdominal pain GU: Negative; No dysuria, hematuria, or difficulty voiding Musculoskeletal: Negative; no myalgias, joint pain, or weakness Hematologic/Oncology: Negative; no easy bruising, bleeding Endocrine: Negative; no heat/cold intolerance; no diabetes Neuro: Negative; no changes in balance, headaches Skin: Negative; No rashes or skin lesions Psychiatric: Negative; No behavioral problems, depression Sleep: Negative; No snoring, daytime sleepiness, hypersomnolence, bruxism, restless legs, hypnogognic hallucinations, no cataplexy Other comprehensive 14 point system review is negative.   PE BP 110/70  Pulse 55  Ht 5\' 3"  (1.6 m)  Wt 145 lb (65.772 kg)  BMI 25.69 kg/m2  Blood pressure when taken by me was 128/76 supine and 120/75 standing General: Alert, oriented, no distress.  Skin: normal turgor, no rashes HEENT: Normocephalic, atraumatic. Pupils round and reactive; sclera anicteric; no nystagmus; extraocular muscles are full Fundi  no AV nicking or arteriolar narrowing. No hemorrhages or exudates.  Nose without nasal septal hypertrophy Mouth/Parynx benign; Mallinpatti scale 3 Neck: No JVD, no carotid bruits; normal carotid upstroke. No bisferens pulse Lungs: clear to ausculatation and percussion; no wheezing or rales Chest wall: without tenderness to palpitation Heart: RRR, s1 s2 normal 1-2/6 systolic murmur left sternal border; no diastolic murmur.  No rubs, thrills or heaves. Abdomen: soft, nontender; no hepatosplenomehaly, BS+; abdominal aorta nontender and not dilated by palpation. Back: no CVA tenderness Pulses  2+ Extremities: no clubbing cyanosis or edema, Homan's sign negative  Neurologic: grossly nonfocal; Cranial nerves grossly wnl, Specifically, she had a fairly normal finger to nose test. She also had fairly normal rapid alternating movements. Psychologic: Normal mood and affect   ECG (independently read by me): Sinus bradycardia 55 beats per minute.  QTc interval 440 ms.  No ST segment changes.  December 2014 ECG: Normal sinus rhythm at 50. PR interval 176 ms, QTc interval 430 ms. No significant ST-T changes.  LABS:  BMET    Component Value Date/Time   NA 138 06/26/2013 1133   K 4.7 06/26/2013 1133   CL 101 06/26/2013 1133   CO2 28 06/26/2013 1133   GLUCOSE 98 06/26/2013 1133   BUN 17 06/26/2013 1133   CREATININE 0.60 06/26/2013 1133   CALCIUM 9.9 06/26/2013 1133   GFRNONAA 89 03/01/2013 0950   GFRAA >89 03/01/2013 0950     Hepatic Function Panel     Component Value Date/Time   PROT 6.7 06/26/2013 1133   ALBUMIN 4.3 06/26/2013 1133   AST 23 06/26/2013 1133   ALT 33 06/26/2013 1133  ALKPHOS 82 06/26/2013 1133   BILITOT 0.5 06/26/2013 1133     CBC    Component Value Date/Time   WBC 6.6 06/26/2013 1133   RBC 4.19 06/26/2013 1133   HGB 12.4 06/26/2013 1133   HCT 37.4 06/26/2013 1133   PLT 284 06/26/2013 1133   MCV 89.3 06/26/2013 1133   MCH 29.6 06/26/2013 1133   MCHC 33.2 06/26/2013 1133   RDW 13.6 06/26/2013 1133   LYMPHSABS 1.8 03/01/2013 0953   MONOABS 0.9 03/01/2013 0953   EOSABS 0.1 03/01/2013 0953   BASOSABS 0.0 03/01/2013 0953     BNP No results found for this basename: probnp    Lipid Panel  No results found for this basename: chol,  trig,  hdl,  cholhdl,  vldl,  ldlcalc     RADIOLOGY: No results found.   ASSESSMENT AND PLAN:  Ms. Sperry is a 73 year old female with history of sick sinus syndrome with paroxysmal atrial fibrillation.  She has been maintaining normal sinus rhythm and is tolerating sotalol 120 mg twice a day.  Her QTc interval is normal at 440 ms.  Her chief  complaint today is that of increased heartburn symptoms.  She does have a history of GERD and has been taking Prilosec.  I will change this to Protonix 40 mg daily.  Her last stress test was over 5 years ago.  I am recommending she undergo a LexiScan Myoview study to make certain her "indigestion "is not potential ischemic chest pain.  She does have a history of hyperlipidemia and has been tolerating simvastatin 40 mg.  She is on Coumadin anticoagulation and her INR today is therapeutic at 2.0.  I am recommending a complete set of blood work obtained in the fasting state.  I will schedule her for LexiScan Myoview, and we'll see her back in the office in approximately 6-8 weeks for followup evaluation or sooner if problems arise.   Time spent: 25 min   Lennette Bihari, MD, [facility] 05/03/2014 9:36 AM

## 2014-05-13 LAB — CBC
HCT: 37.9 % (ref 36.0–46.0)
Hemoglobin: 12.7 g/dL (ref 12.0–15.0)
MCH: 30.5 pg (ref 26.0–34.0)
MCHC: 33.5 g/dL (ref 30.0–36.0)
MCV: 91.1 fL (ref 78.0–100.0)
PLATELETS: 250 10*3/uL (ref 150–400)
RBC: 4.16 MIL/uL (ref 3.87–5.11)
RDW: 13.4 % (ref 11.5–15.5)
WBC: 5.3 10*3/uL (ref 4.0–10.5)

## 2014-05-13 LAB — COMPREHENSIVE METABOLIC PANEL
ALK PHOS: 61 U/L (ref 39–117)
ALT: 17 U/L (ref 0–35)
AST: 18 U/L (ref 0–37)
Albumin: 4.2 g/dL (ref 3.5–5.2)
BUN: 16 mg/dL (ref 6–23)
CO2: 28 mEq/L (ref 19–32)
Calcium: 9.3 mg/dL (ref 8.4–10.5)
Chloride: 106 mEq/L (ref 96–112)
Creat: 0.65 mg/dL (ref 0.50–1.10)
Glucose, Bld: 106 mg/dL — ABNORMAL HIGH (ref 70–99)
Potassium: 5.3 mEq/L (ref 3.5–5.3)
SODIUM: 140 meq/L (ref 135–145)
TOTAL PROTEIN: 6.5 g/dL (ref 6.0–8.3)
Total Bilirubin: 0.4 mg/dL (ref 0.2–1.2)

## 2014-05-13 LAB — LIPID PANEL
Cholesterol: 177 mg/dL (ref 0–200)
HDL: 43 mg/dL (ref >39)
LDL CALC: 96 mg/dL (ref 0–99)
TRIGLYCERIDES: 189 mg/dL — AB (ref <150)
Total CHOL/HDL Ratio: 4.1 Ratio
VLDL: 38 mg/dL (ref 0–40)

## 2014-05-13 LAB — TSH: TSH: 2.573 u[IU]/mL (ref 0.350–4.500)

## 2014-05-16 ENCOUNTER — Encounter (HOSPITAL_COMMUNITY): Payer: Commercial Managed Care - HMO

## 2014-05-21 ENCOUNTER — Telehealth (HOSPITAL_COMMUNITY): Payer: Self-pay

## 2014-05-21 NOTE — Telephone Encounter (Signed)
Encounter complete. 

## 2014-05-23 ENCOUNTER — Ambulatory Visit (HOSPITAL_COMMUNITY)
Admission: RE | Admit: 2014-05-23 | Discharge: 2014-05-23 | Disposition: A | Discharge status: Home / Self Care | Payer: Medicare HMO | Source: Ambulatory Visit | Attending: Cardiology | Admitting: Cardiology

## 2014-05-23 DIAGNOSIS — R001 Bradycardia, unspecified: Secondary | ICD-10-CM | POA: Diagnosis not present

## 2014-05-23 DIAGNOSIS — R002 Palpitations: Secondary | ICD-10-CM | POA: Diagnosis present

## 2014-05-23 DIAGNOSIS — R5383 Other fatigue: Secondary | ICD-10-CM | POA: Diagnosis present

## 2014-05-23 DIAGNOSIS — R55 Syncope and collapse: Secondary | ICD-10-CM | POA: Diagnosis present

## 2014-05-23 DIAGNOSIS — I480 Paroxysmal atrial fibrillation: Secondary | ICD-10-CM

## 2014-05-23 MED ORDER — TECHNETIUM TC 99M SESTAMIBI GENERIC - CARDIOLITE
30.0000 | Freq: Once | INTRAVENOUS | Status: AC | PRN
  Administered 2014-05-23: 30 via INTRAVENOUS

## 2014-05-23 MED ORDER — REGADENOSON 0.4 MG/5ML IV SOLN
0.4000 mg | Freq: Once | INTRAVENOUS | Status: AC
  Administered 2014-05-23: 0.4 mg via INTRAVENOUS

## 2014-05-23 MED ORDER — TECHNETIUM TC 99M SESTAMIBI GENERIC - CARDIOLITE
10.0000 | Freq: Once | INTRAVENOUS | Status: AC | PRN
  Administered 2014-05-23: 10 via INTRAVENOUS

## 2014-05-23 MED ORDER — AMINOPHYLLINE 25 MG/ML IV SOLN
75.0000 mg | Freq: Once | INTRAVENOUS | Status: AC
  Administered 2014-05-23: 75 mg via INTRAVENOUS

## 2014-05-23 NOTE — Procedures (Addendum)
[facility] [address] [place] [address] [phone]  Cardiology Nuclear Med Study  Julie CoveRuby H Zink is a 73 y.o. female     MRN : [id number]     DOB: 1941-01-07  Procedure Date: 05/23/2014  Nuclear Med Background Indication for Stress Test:  Evaluation for Ischemia History:  Asthma and PAF;SSS;Last NUC MPI on 10/11/2008-nonischemic;EF=79% Cardiac Risk Factors: Family History - CAD and Lipids  Symptoms:  DOE, Fatigue, Light-Headedness, Near Syncope and Palpitations   Nuclear Pre-Procedure Caffeine/Decaff Intake:  9:00pm NPO After: 7:00am   IV Site: R Forearm  IV 0.9% NS with Angio Cath:  22g  Chest Size (in):  n/a IV Started by: Berdie OgrenAmanda Wease, RN  Height: 5\' 3"  (1.6 m)  Cup Size: C  BMI:  Body mass index is 25.69 kg/(m^2). Weight:  145 lb (65.772 kg)   Tech Comments:  n/a    Nuclear Med Study 1 or 2 day study: 1 day  Stress Test Type:  Lexiscan  Order Authorizing Provider:  Nicki Guadalajarahomas Kelly, MD   Resting Radionuclide: Technetium 3712m Sestamibi  Resting Radionuclide Dose: 10.1 mCi   Stress Radionuclide:  Technetium 512m Sestamibi  Stress Radionuclide Dose: 30.9 mCi           Stress Protocol Rest HR: 55 Stress HR: 75  Rest BP: 149/101 Stress BP: 155/97  Exercise Time (min): n/a METS: n/a   Predicted Max HR: 148 bpm % Max HR: 50.68 bpm Rate Pressure Product: [id number]  Dose of Adenosine (mg):  n/a Dose of Lexiscan: 0.4 mg  Dose of Atropine (mg): n/a Dose of Dobutamine: n/a mcg/kg/min (at max HR)  Stress Test Technologist: Esperanza Sheetserry-Marie Martin, CCT Nuclear Technologist: Koren Shiverobin Moffitt, CNMT   Rest Procedure:  Myocardial perfusion imaging was performed at rest 45 minutes following the intravenous administration of Technetium 2412m Sestamibi. Stress Procedure:  The patient received IV Lexiscan 0.4 mg over 15-seconds.  Technetium 6312m Sestamibi injected IV at 30-seconds. Patient experienced SOB and 75 mg Aminophylline IV was  administered. There were no significant changes with Lexiscan.  Quantitative spect images were obtained after a 45 minute delay.  Transient Ischemic Dilatation (Normal <1.22):  1.23 QGS EDV:  59 ml QGS ESV:  19 ml LV Ejection Fraction: 68%     Rest ECG: Sinus bradycardia with no ST changes.  Stress ECG: No significant ST segment change suggestive of ischemia.  QPS Raw Data Images:  Acquisition technically good; normal left ventricular size. Stress Images:  There is decreased uptake in the anterior wall. Rest Images:  There is decreased uptake in the anterior wall. Subtraction (SDS):  No evidence of ischemia.  Impression Exercise Capacity:  Lexiscan with no exercise. BP Response:  Normal blood pressure response. Clinical Symptoms:  There is dyspnea. ECG Impression:  No significant ST segment change suggestive of ischemia. Comparison with Prior Nuclear Study: Compared to 10/11/08, no significant change.   Overall Impression:  Low risk stress nuclear study with a small, mild intensity, fixed anterior defect consistent with soft tissue attenuation; no ischemia.  LV Wall Motion:  NL LV Function; NL Wall Motion   Olga MillersBrian Kirke Breach, MD  05/23/2014 12:32 PM

## 2014-05-28 ENCOUNTER — Telehealth: Payer: Self-pay | Admitting: *Deleted

## 2014-05-28 NOTE — Telephone Encounter (Signed)
-----   Message from Lennette Biharihomas A Kelly, MD sent at 05/24/2014  4:01 AM EDT ----- Low risk; attenuation artifact; no ischemia

## 2014-05-28 NOTE — Telephone Encounter (Signed)
Spoke to patient. Result given . Verbalized understanding  

## 2014-05-31 ENCOUNTER — Ambulatory Visit (INDEPENDENT_AMBULATORY_CARE_PROVIDER_SITE_OTHER): Payer: Commercial Managed Care - HMO | Admitting: Pharmacist Clinician (PhC)/ Clinical Pharmacy Specialist

## 2014-05-31 DIAGNOSIS — Z7901 Long term (current) use of anticoagulants: Secondary | ICD-10-CM

## 2014-05-31 DIAGNOSIS — I4891 Unspecified atrial fibrillation: Secondary | ICD-10-CM

## 2014-05-31 LAB — POCT INR: INR: 2.1

## 2014-06-25 ENCOUNTER — Ambulatory Visit (INDEPENDENT_AMBULATORY_CARE_PROVIDER_SITE_OTHER): Payer: Commercial Managed Care - HMO | Admitting: *Deleted

## 2014-06-25 ENCOUNTER — Ambulatory Visit (INDEPENDENT_AMBULATORY_CARE_PROVIDER_SITE_OTHER): Payer: Commercial Managed Care - HMO | Admitting: Cardiovascular Disease

## 2014-06-25 ENCOUNTER — Encounter: Payer: Self-pay | Admitting: Cardiovascular Disease

## 2014-06-25 VITALS — BP 114/80 | HR 51 | Ht 63.0 in | Wt 145.0 lb

## 2014-06-25 DIAGNOSIS — I480 Paroxysmal atrial fibrillation: Secondary | ICD-10-CM

## 2014-06-25 DIAGNOSIS — I4891 Unspecified atrial fibrillation: Secondary | ICD-10-CM

## 2014-06-25 DIAGNOSIS — K219 Gastro-esophageal reflux disease without esophagitis: Secondary | ICD-10-CM

## 2014-06-25 DIAGNOSIS — Z7901 Long term (current) use of anticoagulants: Secondary | ICD-10-CM

## 2014-06-25 DIAGNOSIS — I340 Nonrheumatic mitral (valve) insufficiency: Secondary | ICD-10-CM

## 2014-06-25 DIAGNOSIS — E781 Pure hyperglyceridemia: Secondary | ICD-10-CM

## 2014-06-25 LAB — POCT INR: INR: 1.3

## 2014-06-25 NOTE — Patient Instructions (Addendum)
Your physician wants you to follow-up in: 6 months or sooner if needed. You will receive a reminder letter in the mail two months in advance. If you don't receive a letter, please call our office to schedule the follow-up appointment.  Your physician has recommended you make the following change in your medication: pick up some fish oil and centrum silver over the counter. Take 2 capsules of the fish oil.  Your physician recommends that you return for lab work in: 6 months. Lab orders will be sent to you.

## 2014-06-25 NOTE — Progress Notes (Signed)
Patient ID: Dorthea CoveRuby H Simoneau, female   DOB: 21-Oct-1940, 73 y.o.   MRN: [id number]     HPI: [name]  is a 73 year old is a former patient of Dr. Alanda AmassWeintraub who established care with me after his retirement.  I had seen her in October 2015 for a 10 month follow-up evaluation and she presents today for follow-up evaluation.  [name] has a history of sick sinus syndrome with paroxysmal atrial fibrillation and has been on chronic sotalol therapy. She also has a history of anemia and  diverticular disease. She has been on chronic Coumadin anticoagulation therapy. An echo Doppler study in August 2014 showed mild left ventricular hypertrophy with ejection fraction of 55-60% without regional wall motion abnormalities. She did have mitral annular calcification with moderate mitral regurgitation directed centrally. She had mild-to-moderate LA dilatation and mild RA dilatation. In 2010 she underwent a nuclear perfusion study which showed fairly normal perfusion. In May 2014 she underwent carotid studies which were essentially normal.  When I initially saw her, she was complaining that she was noticing more  palpitations that would often last up to 30 seconds. At that time, I recommended that she titrate her sotalol from 120 mg in the morning and 80 mg at night to 120 mg twice a day. Laboratory demonstrated a normal magnesium level at 2.3. She had normal electrolytes and previously had normal thyroid function. Her hemoglobin was 12.4 hematocrit 37.4 She also has noted some shortness of breath.  She has tolerated the increased dose of sotalol at 120 mg twice a day without recurrent atrial fibrillation.  She does have a history of GERD and has been on chronic omeprazole 20 mg.  Recently she's noticed increasing heartburn symptoms.  There is mild shortness of breath with activity.  Her last stress test was 5-1/2 years ago.  She has seen Dr.Yan for imbalance issues and ultimately [place]. Since I last saw her, she tells me she was taken off her statin therapy by Dr. Leanna BattlesMalone due to concerns of hip osteopenia.  However, she is still on proton pump inhibition, which has not been altered and may affect calcium was stasis.  Presently, [name] denies chest pain.  She denies palpitations.  She underwent a nuclear perfusion study in October 2015 which was low risk and showed a small, mild intensity fixed anterior defect consistent with soft tissue attenuation without scar or ischemia.  She had normal wall motion and LV function.  Ejection fraction was 68%.  She also had laboratory done in October.  Her potassium was 5.3, and she admits admits to having orange juice daily as well as several bananas per week.  Lipid studies revealed a cholesterol of 177, triglycerides 189, HDL 43, LDL 96.  Fasting glucose 106.  She presents now for evaluation     Past Surgical History  Procedure Laterality Date  . Abdominal hysterectomy    . Rectocele repair    . Lymph nodes      left groin lymph node biopsies for benign reasons  . Left femoral hernia repair      Allergies  Allergen Reactions  . Sulfur Nausea Only  . Zithromax [Azithromycin] Nausea And Vomiting  . Amoxapine And Related     Current Outpatient Prescriptions  Medication Sig Dispense Refill  . Calcium Carbonate-Vit D-Min (CALCIUM 1200 PO) Take 1 tablet by mouth 2 (two) times daily.    . chlorhexidine (PERIDEX) 0.12 % solution Rinse as directed    .  docusate sodium (COLACE) 100 MG capsule Take 100 mg by mouth 2 (two) times daily. 1 capsule each morning and 2 capsules each evening    . HYDROcodone-acetaminophen (NORCO/VICODIN) 5-325 MG per tablet Take 1 tablet by mouth as needed.    . iron polysaccharides (NIFEREX) 150 MG capsule Take 1 capsule (150 mg total) by mouth daily. 30 capsule 11  . pantoprazole (PROTONIX) 40 MG tablet Take 1 tablet (40 mg total) by mouth daily. 30 tablet 11  . psyllium (METAMUCIL)  58.6 % packet Take 1 packet by mouth daily.    . sotalol (BETAPACE) 120 MG tablet Take 1 tablet (120 mg total) by mouth 2 (two) times daily. One in a half Am and one half at night. (Patient taking differently: Take 120 mg by mouth 2 (two) times daily. ) 60 tablet 2  . temazepam (RESTORIL) 15 MG capsule Take 1 capsule (15 mg total) by mouth at bedtime as needed for sleep. 10 capsule 0  . warfarin (COUMADIN) 5 MG tablet Take 1 tablet by mouth daily or as directed by coumadin clinic 90 tablet 1   No current facility-administered medications for this visit.    Social history is notable in that she is married and has 3 children 4 grandchildren. He is no history of tobacco use. She has worked Surveyor, mineralspart-time making handbags.  Family History  Problem Relation Age of Onset  . Heart failure Mother   . Heart failure Father   . Diabetes Father    ROS General: Negative; No fevers, chills, or night sweats;  HEENT: Negative; No changes in vision or hearing, sinus congestion, difficulty swallowing Pulmonary: Negative; No cough, wheezing, shortness of breath, hemoptysis Cardiovascular: Negative; No chest pain, presyncope, syncope, palpitations GI: Positive for GERD and diverticular disease; No nausea, vomiting, diarrhea, or abdominal pain GU: Negative; No dysuria, hematuria, or difficulty voiding Musculoskeletal: Negative; no myalgias, joint pain, or weakness Hematologic/Oncology: Negative; no easy bruising, bleeding Endocrine: Negative; no heat/cold intolerance; no diabetes Neuro: Negative; no changes in balance, headaches Skin: Negative; No rashes or skin lesions Psychiatric: Negative; No behavioral problems, depression Sleep: Negative; No snoring, daytime sleepiness, hypersomnolence, bruxism, restless legs, hypnogognic hallucinations, no cataplexy Other comprehensive 14 point system review is negative.   PE BP 114/80 mmHg  Pulse 51  Ht 5\' 3"  (1.6 m)  Wt 145 lb (65.772 kg)  BMI 25.69 kg/m2  Blood  pressure when taken by me was 128/76 supine and 120/75 standing General: Alert, oriented, no distress.  Skin: normal turgor, no rashes HEENT: Normocephalic, atraumatic. Pupils round and reactive; sclera anicteric; no nystagmus; extraocular muscles are full Fundi  no AV nicking or arteriolar narrowing. No hemorrhages or exudates.  Nose without nasal septal hypertrophy Mouth/Parynx benign; Mallinpatti scale 3 Neck: No JVD, no carotid bruits; normal carotid upstroke. No bisferens pulse Lungs: clear to ausculatation and percussion; no wheezing or rales Chest wall: without tenderness to palpitation Heart: RRR, s1 s2 normal 1-2/6 systolic murmur left sternal border; no diastolic murmur.  No rubs, thrills or heaves. Abdomen: soft, nontender; no hepatosplenomehaly, BS+; abdominal aorta nontender and not dilated by palpation. Back: no CVA tenderness Pulses 2+ Extremities: no clubbing cyanosis or edema, Homan's sign negative  Neurologic: grossly nonfocal; Cranial nerves grossly wnl, Specifically, she had a fairly normal finger to nose test. She also had fairly normal rapid alternating movements. Psychologic: Normal mood and affect  ECG (independently read by me): Sinus bradycardia 51 bpm with sinus arrhythmia and APC.  QTc interval 431 ms.  October 2015  ECG (independently read by me): Sinus bradycardia 55 beats per minute.  QTc interval 440 ms.  No ST segment changes.  December 2014 ECG: Normal sinus rhythm at 50. PR interval 176 ms, QTc interval 430 ms. No significant ST-T changes.  LABS:  BMET    Component Value Date/Time   NA 140 05/13/2014 0812   K 5.3 05/13/2014 0812   CL 106 05/13/2014 0812   CO2 28 05/13/2014 0812   GLUCOSE 106* 05/13/2014 0812   BUN 16 05/13/2014 0812   CREATININE 0.65 05/13/2014 0812   CALCIUM 9.3 05/13/2014 0812   GFRNONAA 89 03/01/2013 0950   GFRAA >89 03/01/2013 0950     Hepatic Function Panel     Component Value Date/Time   PROT 6.5 05/13/2014 0812    ALBUMIN 4.2 05/13/2014 0812   AST 18 05/13/2014 0812   ALT 17 05/13/2014 0812   ALKPHOS 61 05/13/2014 0812   BILITOT 0.4 05/13/2014 0812     CBC    Component Value Date/Time   WBC 5.3 05/13/2014 0812   RBC 4.16 05/13/2014 0812   HGB 12.7 05/13/2014 0812   HCT 37.9 05/13/2014 0812   PLT 250 05/13/2014 0812   MCV 91.1 05/13/2014 0812   MCH 30.5 05/13/2014 0812   MCHC 33.5 05/13/2014 0812   RDW 13.4 05/13/2014 0812   LYMPHSABS 1.8 03/01/2013 0953   MONOABS 0.9 03/01/2013 0953   EOSABS 0.1 03/01/2013 0953   BASOSABS 0.0 03/01/2013 0953     BNP No results found for: PROBNP  Lipid Panel     Component Value Date/Time   CHOL 177 05/13/2014 0812     RADIOLOGY: No results found.   ASSESSMENT AND PLAN: Ms. Witzke is a 73 year old female with history of sick sinus syndrome and paroxysmal atrial fibrillation.  She has been maintaining normal sinus rhythm and is tolerating sotalol 120 mg twice a day.  Her QTc interval is normal at 431 ms.  .  When she was seen in October, she complained of increasing GERD symptoms.  She is on Protonix 40 mg daily with improvement.  A nuclear perfusion study was done which did not reveal ischemia and suggested mild attenuation artifact.  She has been maintained on Coumadin anticoagulation.  She has been taken off statin therapy by Dr. Leanna Battles.  Her most recent lipid studies reveal an elevation of her triglycerides as well as mild glucose elevation.  HDL is 43, LDL 96.  I have suggested the addition of 2 capsules of over-the-counter fish oil daily, which will help her mild hypertriglyceridemia.  Her blood pressure today is controlled.  She's not had any recurrent atrial fibrillation and is maintaining sinus bradycardia and tolerating sotalol.  It is possible that chronic PPI therapy may be contributing to her osteopenia.  Due to its effects on calcium absorption.  Repeat laboratory will be obtained in 6 months.  I will see her in follow-up at that time and  further recommendations will be made.  She also was confused with my chart and did not know how to utilize this and had forgotten her prior password.  This was set up for readjustment today.  Time spent: 25 min   Lennette Bihari, MD, [facility] 06/25/2014 12:07 PM

## 2014-06-26 ENCOUNTER — Ambulatory Visit: Payer: Commercial Managed Care - HMO | Admitting: Pharmacist Clinician (PhC)/ Clinical Pharmacy Specialist

## 2014-07-05 ENCOUNTER — Ambulatory Visit (INDEPENDENT_AMBULATORY_CARE_PROVIDER_SITE_OTHER): Payer: Commercial Managed Care - HMO | Admitting: Pharmacist Clinician (PhC)/ Clinical Pharmacy Specialist

## 2014-07-05 VITALS — Wt 146.7 lb

## 2014-07-05 DIAGNOSIS — I480 Paroxysmal atrial fibrillation: Secondary | ICD-10-CM

## 2014-07-05 DIAGNOSIS — Z7901 Long term (current) use of anticoagulants: Secondary | ICD-10-CM

## 2014-07-05 LAB — POCT INR: INR: 1.5

## 2014-07-15 ENCOUNTER — Encounter: Payer: Self-pay | Admitting: Neurology

## 2014-07-15 ENCOUNTER — Ambulatory Visit (INDEPENDENT_AMBULATORY_CARE_PROVIDER_SITE_OTHER): Payer: Commercial Managed Care - HMO | Admitting: Neurology

## 2014-07-15 VITALS — BP 120/70 | HR 62 | Ht 64.0 in | Wt 143.0 lb

## 2014-07-15 DIAGNOSIS — R29818 Other symptoms and signs involving the nervous system: Secondary | ICD-10-CM

## 2014-07-15 DIAGNOSIS — I4891 Unspecified atrial fibrillation: Secondary | ICD-10-CM

## 2014-07-15 DIAGNOSIS — R2689 Other abnormalities of gait and mobility: Secondary | ICD-10-CM

## 2014-07-15 NOTE — Progress Notes (Signed)
[facility]  PATIENT: Julie Kemp DOB: 02/08/1941  HISTORICAL Mrs. Osberg is a 73 years old right-handed Caucasian female, referred by her primary care physician Dr. Sherral Hammers, and her cardiologist Dr. Nicholaus Bloom  for evaluation of unbalance.  She has past medical history of atrial fibrillation, on chronic Coumadin treatment, has been on chronic zinc supplement, during the winter months, 50 mg for 6 months each year. 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[employer], sometimes complains bilateral fingertips paresthesia, turning white in cold weather,  She notice head shaking, She denies neck pain, no significant low back pain, she has stress incontinence, no significant bilateral lower extremity weakness  UPDATE March 16th 2015:  She continued to have gait difficulty, only slight lower extremity paresthesia, she also complains of stress urinary incontinence, chronic constipation,  We have reviewed MRI of the brain, mild small vessel disease, significant cerebellum atrophy, MRI of cervical spine, mild multilevel degenerative disc disease, but there was no evidence of cord compression  Laboratory showed normal B12, copper level,  EMG nerve conduction study showed bilateral carpal tunnel syndrome, but no evidence of large fiber neuropathy.  UPDATE June 22nd 2015:  She continued to have gradual worsening gait difficulty, also complains of lightheaded when standing up, we have revealed MRI of thoracic spine, mild degenerative disc disease, no significant canal stenosis, MRI of lumbar spine, which also demonstrate multilevel degenerative disc disease, with multiple level  mild foraminal stenosis,  MRI of  the brain, did show significant cerebellum atrophy, which is inconsistent with her complains of gait difficulty, trunk ataxia,  There was no family history of similar problem," my siblings are older than me, but they gets around better"  UPDATE Jul 15 2014: She was evaluated by [facility] neurologist Dr. Leanna Battles in Nov 16th 2015, statin was stopped, later fish oil was started by her cardiologist Dr. Nicholaus Bloom.  She still staggers, she did have physical therapy, it helped her some, she denies significant pain, She noticed ends of fingers turns white, feel numb  When it is  cold.    REVIEW OF SYSTEMS: Full 14 system review of systems performed and notable only for runny nose, trouble swallowing, cough, shortness of breath, palpitation, urgency, back pain, walking difficulty, coordination problems  ALLERGIES: Allergies  Allergen Reactions  . Sulfur Nausea Only  . Zithromax [Azithromycin] Nausea And Vomiting  . Amoxapine And Related     HOME MEDICATIONS: Outpatient Prescriptions Prior to Visit  Medication Sig Dispense Refill  . Calcium Carbonate-Vit D-Min (CALCIUM 1200 PO) Take 1 tablet by mouth 2 (two) times daily.      . iron polysaccharides (NIFEREX) 150 MG capsule Take 1 capsule (150 mg total) by mouth daily.  30 capsule  11  . Misc Natural Products (FOCUSED MIND) CAPS Take 1 capsule by mouth 2 (two) times daily.      Marland Kitchen omeprazole (PRILOSEC) 20 MG capsule Take 20 mg by mouth daily.      . simvastatin (ZOCOR) 40 MG tablet Take 1 tablet (40 mg total) by mouth at bedtime.  90 tablet  2  . temazepam (RESTORIL) 15 MG capsule Take 15 mg by mouth at bedtime as needed for sleep.      Marland Kitchen warfarin (COUMADIN) 5  MG tablet TAKE ONE TABLET BY MOUTH ONCE DAILY OR AS DIRECTED  90 tablet  0  . Zinc 50 MG CAPS Take 1 capsule by mouth daily. Half tablet at night.      . sotalol (BETAPACE) 120 MG tablet Take 1 tablet (120 mg total) by mouth 2 (two) times daily.  60 tablet  6     PAST MEDICAL  HISTORY: Past Medical History  Diagnosis Date  . Atrial fibrillation   . Unsteady gait   . Asthma     PAST SURGICAL HISTORY: Past Surgical History  Procedure Laterality Date  . Abdominal hysterectomy      FAMILY HISTORY: Family History  Problem Relation Age of Onset  . Heart failure Mother   . Heart failure Father   . Diabetes Father     SOCIAL HISTORY:  History   Social History  . Marital Status: Widowed    Spouse Name: N/A    Number of Children: 3  . Years of Education: 9 th   Occupational History    Retired from sewing    Social History Main Topics  . Smoking status: Never Smoker   . Smokeless tobacco: Never Used  . Alcohol Use: No  . Drug Use: No  . Sexual Activity: Not on file   Other Topics Concern  . Not on file   Social History Narrative   Patient lives at home alone. Patient is widowed.   Retired.   Education 9th grade   Right handed.   Caffeine - None   PHYSICAL EXAM   Filed Vitals:   07/15/14 1016  Height: 5\' 4"  (1.626 m)  Weight: 143 lb (64.864 kg)    Body mass index is 24.53 kg/(m^2).   Generalized: In no acute distress  Neck: Supple, no carotid bruits   Cardiac: Regular rate rhythm  Pulmonary: Clear to auscultation bilaterally  Musculoskeletal: No deformity  Neurological examination  Mentation: alert oriented to time, place, history taking, and causual conversation, she has moderate left turn, mild right tilt, frequent no-no shaking , mid hard of hearing.  Cranial nerve II-XII: Pupils were equal round reactive to light extraocular movements were full, Visual field were full on confrontational test. Bilateral fundi were sharp.  Facial sensation were normal. Hearing was intact to finger rubbing bilaterally. Uvula tongue midline.  head turning and shoulder shrug and were normal and symmetric.Tongue protrusion into cheek strength was normal.  Motor: Mild bilateral shoulder abduction, hip flexion weakness,  Sensory: Intact to  fine touch, pinprick, decreased vibratory sensation in her   toes, and preserved proprioception at toes.  Coordination: Normal finger to nose, heel-to-shin bilaterally, she has mild to moderate trunk ataxia,  Gait: Mildly wide-based cautious gait, unsteady, has difficulty perform tiptoe, heel walking, she could not do tandem walking  Romberg signs: Positive  Deep tendon reflexes: Brachioradialis 2/2, biceps 2/2, triceps 2/2, patellar 2/2, Achilles absent, plantar responses were flexor bilaterally.   DIAGNOSTIC DATA (LABS, IMAGING, TESTING) - I reviewed patient records, labs, notes, testing and imaging myself where available.  Lab Results  Component Value Date   WBC 5.3 05/13/2014   HGB 12.7 05/13/2014   HCT 37.9 05/13/2014   MCV 91.1 05/13/2014   PLT 250 05/13/2014      Component Value Date/Time   NA 140 05/13/2014 0812   K 5.3 05/13/2014 0812   CL 106 05/13/2014 0812   CO2 28 05/13/2014 0812   GLUCOSE 106* 05/13/2014 0812   BUN 16 05/13/2014 0812   CREATININE  0.65 05/13/2014 0812   CALCIUM 9.3 05/13/2014 0812   PROT 6.5 05/13/2014 0812   ALBUMIN 4.2 05/13/2014 0812   AST 18 05/13/2014 0812   ALT 17 05/13/2014 0812   ALKPHOS 61 05/13/2014 0812   BILITOT 0.4 05/13/2014 0812   GFRNONAA 89 03/01/2013 0950   GFRAA >89 03/01/2013 0950   ASSESSMENT AND PLAN   73 years old Caucasian female, with past medical history of atrial fibrillation, on chronic Coumadin treatment, mild anemia, with gradual onset gait difficulty, mild bilateral proximal upper and lower extremity muscle weakness. normal CPK 51 in January 2015  Differentiation diagnosis including central nervous system degenerative disorder, versus statin induced myopathy Agree her stop taking statin Continue moderate exercise return to clinic in 6 months If she continue worsens, may consider repeat EMG, even consider muscle biopsy    Levert FeinsteinYijun Trenae Brunke, M.D. Ph.D.  [facility] [address], [address] [place], [place] [address] [phone]

## 2014-07-22 ENCOUNTER — Other Ambulatory Visit: Payer: Self-pay | Admitting: *Deleted

## 2014-07-22 ENCOUNTER — Other Ambulatory Visit: Payer: Self-pay | Admitting: Pharmacist Clinician (PhC)/ Clinical Pharmacy Specialist

## 2014-07-22 ENCOUNTER — Ambulatory Visit (INDEPENDENT_AMBULATORY_CARE_PROVIDER_SITE_OTHER): Payer: Commercial Managed Care - HMO | Admitting: Pharmacist Clinician (PhC)/ Clinical Pharmacy Specialist

## 2014-07-22 VITALS — Wt 142.2 lb

## 2014-07-22 DIAGNOSIS — I4891 Unspecified atrial fibrillation: Secondary | ICD-10-CM

## 2014-07-22 DIAGNOSIS — Z7901 Long term (current) use of anticoagulants: Secondary | ICD-10-CM

## 2014-07-22 MED ORDER — POLYSACCHARIDE IRON COMPLEX 150 MG PO CAPS: 150.0000 mg | ORAL_CAPSULE | Freq: Every day | ORAL | Status: DC

## 2014-07-22 MED ORDER — SOTALOL HCL 120 MG PO TABS: 120.0000 mg | ORAL_TABLET | Freq: Two times a day (BID) | ORAL | Status: DC

## 2014-07-22 MED ORDER — WARFARIN SODIUM 5 MG PO TABS: ORAL_TABLET | ORAL | Status: DC

## 2014-07-22 NOTE — Telephone Encounter (Signed)
Rx refill sent to patient pharmacy   

## 2014-07-30 ENCOUNTER — Other Ambulatory Visit: Payer: Self-pay | Admitting: Pharmacist Clinician (PhC)/ Clinical Pharmacy Specialist

## 2014-07-30 MED ORDER — WARFARIN SODIUM 5 MG PO TABS: ORAL_TABLET | ORAL | Status: DC

## 2014-07-30 MED ORDER — PANTOPRAZOLE SODIUM 40 MG PO TBEC: 40.0000 mg | DELAYED_RELEASE_TABLET | Freq: Every day | ORAL | Status: DC

## 2014-07-30 MED ORDER — SOTALOL HCL 120 MG PO TABS: 120.0000 mg | ORAL_TABLET | Freq: Two times a day (BID) | ORAL | Status: DC

## 2014-07-30 NOTE — Addendum Note (Signed)
Addended by: Rosalee KaufmanALVSTAD, Beren Yniguez L. on: 07/30/2014 02:17 PM   Modules accepted: Orders

## 2014-08-05 ENCOUNTER — Ambulatory Visit (INDEPENDENT_AMBULATORY_CARE_PROVIDER_SITE_OTHER): Payer: Commercial Managed Care - HMO | Admitting: Pharmacist Clinician (PhC)/ Clinical Pharmacy Specialist

## 2014-08-05 DIAGNOSIS — I4891 Unspecified atrial fibrillation: Secondary | ICD-10-CM

## 2014-08-05 DIAGNOSIS — Z7901 Long term (current) use of anticoagulants: Secondary | ICD-10-CM

## 2014-08-05 LAB — POCT INR: INR: 1.8

## 2014-08-25 ENCOUNTER — Telehealth: Payer: Self-pay | Admitting: Pharmacist Clinician (PhC)/ Clinical Pharmacy Specialist

## 2014-08-25 NOTE — Telephone Encounter (Signed)
Pt called, LMOM  - fell on Tuesday, cracked 2 vertebrae.  [facility]  States INR drawn at ER was 2.1.  Returned call, advised pt to continue with same dose of warfarin.  Will cancel her appt for Monday, as she cannot drive right now and has no way to get here.  Will have scheduling call to reset for mid to late February.

## 2014-08-26 ENCOUNTER — Ambulatory Visit: Payer: Commercial Managed Care - HMO | Admitting: Pharmacist Clinician (PhC)/ Clinical Pharmacy Specialist

## 2014-08-29 ENCOUNTER — Telehealth: Payer: Self-pay | Admitting: Cardiovascular Disease

## 2014-09-04 NOTE — Telephone Encounter (Signed)
Closed encounter °

## 2014-09-16 ENCOUNTER — Ambulatory Visit: Payer: Commercial Managed Care - HMO | Admitting: Pharmacist Clinician (PhC)/ Clinical Pharmacy Specialist

## 2014-09-16 ENCOUNTER — Ambulatory Visit (INDEPENDENT_AMBULATORY_CARE_PROVIDER_SITE_OTHER): Payer: Commercial Managed Care - HMO | Admitting: Pharmacist Clinician (PhC)/ Clinical Pharmacy Specialist

## 2014-09-16 ENCOUNTER — Telehealth: Payer: Self-pay | Admitting: *Deleted

## 2014-09-16 DIAGNOSIS — I4891 Unspecified atrial fibrillation: Secondary | ICD-10-CM

## 2014-09-16 DIAGNOSIS — Z7901 Long term (current) use of anticoagulants: Secondary | ICD-10-CM

## 2014-09-16 LAB — POCT INR: INR: 2.3

## 2014-09-16 NOTE — Telephone Encounter (Signed)
Patient was seen 09/16/14 by K. Alvstad, PharmD for coumadin check. Was in hallway outside office near elevator and had sudden onset SOB. Gentry Roch. Taylor (check-in) came to get triage RN and [facility]. Patient was found to be sniffling, felt hot but was not diaphoretic. She had caught her breath by the time BelgiumJenna, RN & Brittney, CMA assessed patient.   BP 138/88 HR 69 O2 on RA 94% Patient resting in chair.  Breath sounds clear bilaterally to auscultation.  Patient reported she has episodes like this, where she feels it coming on, she gets hot and flushed and can't catch her breath. She states she had some anxiety medication but it didn't work(?) O2 sats stable on RA, but she reports she did have a pulmonologist at one point but felt better after being released from this specialist's care/ceasing to see this specialist.  Advised patient when these episodes come on, try to focus on deep breathing and maintaining a relaxed disposition. If an anxiety or panic attack comes on, the panic of not feeling like she is able to breathe can heighten the anxiety and so on.  Escorted patient and granddaughter to car via wheelchair. On way to car, granddaughter reported that patient had had a fall/syncopal episode about 1 month ago - did not hit head - reported INR was low at this time. Confirmed that Belenda CruiseKristin was aware of this episode. Was also informed by Belenda CruiseKristin that patient had recently been advised to contact her PCP for her anxiety medication refills, as Dr. Alanda AmassWeintraub has retired and our cardiologists generally do not Rx anxiolytics.   Will route this message to Dr. Tresa EndoKelly to review triage encounter and advise as necessary.

## 2014-09-23 NOTE — Telephone Encounter (Signed)
Reviewed; consider orthostatic checks.  Low risk nuclear study last year. Did she f/u with primary as stated?

## 2014-09-23 NOTE — Telephone Encounter (Signed)
LMTCB

## 2014-09-24 NOTE — Telephone Encounter (Signed)
Pt is calling back to thank you for everything you did to help her when she was in for her last office visit.

## 2014-09-26 NOTE — Telephone Encounter (Signed)
Spoke with patient. She is doing fine. She states she has "coughing spells" and then she will hyperventilate and get anxious about the situation. She thanked Charity fundraiserN for assistance last week.   No further action necessary

## 2014-10-03 ENCOUNTER — Telehealth: Payer: Self-pay | Admitting: Pharmacist Clinician (PhC)/ Clinical Pharmacy Specialist

## 2014-10-03 NOTE — Telephone Encounter (Signed)
Pt called, LMOM with questions about rectal bleeding and shoulder pain.  Returned call, pt states that she had some rectal bleeding yesterday am and today as well.  States today was just slight.  Has had long term problems with hemorrhoids and constipation, agrees that it is probably due to straining.  Advised her to call if not resolved in the next few days.    Regarding shoulder pain, she wasn't sure if we needed to know, as she has been told it is a sign of heart attacks in women.  She states it's only been hurting for past 10-14 days, since her last fall.  She sleeps in a hospital bed and has not been able to get comfortable when watching TV.  Thinks this might be the actual cause.  Agreed with patient, advised to have family help her adjust bed/tv to avoid shoulder strain.

## 2014-10-14 ENCOUNTER — Ambulatory Visit: Payer: Commercial Managed Care - HMO | Admitting: Pharmacist Clinician (PhC)/ Clinical Pharmacy Specialist

## 2014-10-28 ENCOUNTER — Other Ambulatory Visit: Payer: Self-pay

## 2014-11-29 ENCOUNTER — Encounter: Payer: Self-pay | Admitting: *Deleted

## 2014-11-29 ENCOUNTER — Other Ambulatory Visit: Payer: Self-pay | Admitting: *Deleted

## 2014-11-29 DIAGNOSIS — I480 Paroxysmal atrial fibrillation: Secondary | ICD-10-CM

## 2014-11-29 DIAGNOSIS — Z7901 Long term (current) use of anticoagulants: Secondary | ICD-10-CM

## 2014-11-29 DIAGNOSIS — E781 Pure hyperglyceridemia: Secondary | ICD-10-CM

## 2014-12-09 ENCOUNTER — Telehealth: Payer: Self-pay | Admitting: Cardiovascular Disease

## 2014-12-09 NOTE — Telephone Encounter (Signed)
Pt called in wanting to know #1 [facility] and what her limitations are for her fasting labs. Please advise  Thanks

## 2014-12-09 NOTE — Telephone Encounter (Signed)
Spoke to patient - advised on fasting labwork - all questions addressed. Pt verbalized understanding.

## 2014-12-14 LAB — COMPREHENSIVE METABOLIC PANEL
ALK PHOS: 66 U/L (ref 39–117)
ALT: 21 U/L (ref 0–35)
AST: 20 U/L (ref 0–37)
Albumin: 4.1 g/dL (ref 3.5–5.2)
BUN: 17 mg/dL (ref 6–23)
CALCIUM: 9.6 mg/dL (ref 8.4–10.5)
CHLORIDE: 102 meq/L (ref 96–112)
CO2: 24 mEq/L (ref 19–32)
Creat: 0.65 mg/dL (ref 0.50–1.10)
Glucose, Bld: 108 mg/dL — ABNORMAL HIGH (ref 70–99)
POTASSIUM: 4.4 meq/L (ref 3.5–5.3)
Sodium: 136 mEq/L (ref 135–145)
Total Bilirubin: 0.4 mg/dL (ref 0.2–1.2)
Total Protein: 6.6 g/dL (ref 6.0–8.3)

## 2014-12-14 LAB — CBC
HEMATOCRIT: 39.5 % (ref 36.0–46.0)
Hemoglobin: 13.1 g/dL (ref 12.0–15.0)
MCH: 30 pg (ref 26.0–34.0)
MCHC: 33.2 g/dL (ref 30.0–36.0)
MCV: 90.4 fL (ref 78.0–100.0)
MPV: 10.6 fL (ref 8.6–12.4)
Platelets: 274 10*3/uL (ref 150–400)
RBC: 4.37 MIL/uL (ref 3.87–5.11)
RDW: 13.6 % (ref 11.5–15.5)
WBC: 5.4 10*3/uL (ref 4.0–10.5)

## 2014-12-14 LAB — LIPID PANEL
CHOL/HDL RATIO: 7.8 ratio
CHOLESTEROL: 274 mg/dL — AB (ref 0–200)
HDL: 35 mg/dL — ABNORMAL LOW (ref >=46)
LDL Cholesterol: 177 mg/dL — ABNORMAL HIGH (ref 0–99)
TRIGLYCERIDES: 311 mg/dL — AB (ref <150)
VLDL: 62 mg/dL — ABNORMAL HIGH (ref 0–40)

## 2014-12-14 LAB — TSH: TSH: 1.442 u[IU]/mL (ref 0.350–4.500)

## 2014-12-19 ENCOUNTER — Telehealth: Payer: Self-pay | Admitting: Cardiovascular Disease

## 2014-12-19 NOTE — Telephone Encounter (Signed)
I have talked to Vicki MalletKristan Pharm D and she hasn't seen this pt. Since Feb and the staff at [facility] will have to dose the coumadin

## 2014-12-19 NOTE — Telephone Encounter (Signed)
Staff message sent to Vicki Malletkristan Pharm D about [facility]. INR for this pt. Was %>) and repeat was 5.1

## 2014-12-19 NOTE — Telephone Encounter (Signed)
I have informed [facility] that Julie Kemp hasn't been followed here since Feb. And they would need to Dose this pt.s coumadin

## 2014-12-24 ENCOUNTER — Ambulatory Visit (INDEPENDENT_AMBULATORY_CARE_PROVIDER_SITE_OTHER): Payer: Commercial Managed Care - HMO | Admitting: Cardiovascular Disease

## 2014-12-24 ENCOUNTER — Encounter: Payer: Self-pay | Admitting: Cardiovascular Disease

## 2014-12-24 ENCOUNTER — Telehealth: Payer: Self-pay | Admitting: Cardiovascular Disease

## 2014-12-24 ENCOUNTER — Ambulatory Visit (INDEPENDENT_AMBULATORY_CARE_PROVIDER_SITE_OTHER): Payer: Commercial Managed Care - HMO | Admitting: *Deleted

## 2014-12-24 VITALS — BP 132/84 | HR 56 | Ht 64.0 in | Wt 140.1 lb

## 2014-12-24 DIAGNOSIS — Z7901 Long term (current) use of anticoagulants: Secondary | ICD-10-CM

## 2014-12-24 DIAGNOSIS — E781 Pure hyperglyceridemia: Secondary | ICD-10-CM

## 2014-12-24 DIAGNOSIS — I4891 Unspecified atrial fibrillation: Secondary | ICD-10-CM

## 2014-12-24 DIAGNOSIS — E785 Hyperlipidemia, unspecified: Secondary | ICD-10-CM

## 2014-12-24 DIAGNOSIS — I480 Paroxysmal atrial fibrillation: Secondary | ICD-10-CM

## 2014-12-24 LAB — POCT INR: INR: 3.3

## 2014-12-24 MED ORDER — EZETIMIBE 10 MG PO TABS: 10.0000 mg | ORAL_TABLET | Freq: Every day | ORAL | Status: DC

## 2014-12-24 MED ORDER — ICOSAPENT ETHYL 1 G PO CAPS: 2.0000 | ORAL_CAPSULE | Freq: Two times a day (BID) | ORAL | Status: DC

## 2014-12-24 NOTE — Patient Instructions (Addendum)
Your INR Tuesday May 31st = 3.3 Please contact [facility] to adjust your warfarin dose  Your physician has recommended you make the following change in your medication: start new prescriptions for Vascepa (fish oil) and Zetia. These prescriptions has already been sent to your pharmacy.  Your physician recommends that you return for lab work in: 3 months.  Your physician recommends that you schedule a follow-up appointment in: 4 months.

## 2014-12-24 NOTE — Telephone Encounter (Signed)
Rx(s) sent to pharmacy electronically.  

## 2014-12-24 NOTE — Telephone Encounter (Signed)
Calling because she would like to have [facility] , because at her local pharmacy its to expensive and wants to know should she continue to do as she has been doing . Please call

## 2014-12-26 ENCOUNTER — Encounter: Payer: Self-pay | Admitting: Cardiovascular Disease

## 2014-12-26 NOTE — Progress Notes (Signed)
Patient ID: Julie Kemp, female   DOB: [birth year], 74 y.o.   MRN: [id number]     HPI: Julie Kemp  is a 74 year old is a former patient of Dr. Rollene Fare who established care with me after his retirement.  I last saw her 6 months ago and she presents for follow-up evaluation  Julie Kemp has a history of sick sinus syndrome with PAF and has been on chronic sotalol therapy. She also has a history of anemia and diverticular disease. She has been on chronic Coumadin anticoagulation therapy. An echo Doppler study in August 2014 showed mild left ventricular hypertrophy with ejection fraction of 55-60% without regional wall motion abnormalities. She did have mitral annular calcification with moderate mitral regurgitation directed centrally. She had mild-to-moderate LA dilatation and mild RA dilatation. In 2010 a nuclear perfusion study showed fairly normal perfusion. In May 2014 she underwent carotid studies which were essentially normal.  When I initially saw her in October 2015 she was complaining that she was noticing more  palpitations that would often last up to 30 seconds. At that time, I recommended that she titrate her sotalol from 120 mg in the morning and 80 mg at night to 120 mg twice a day. Laboratory demonstrated a normal magnesium level at 2.3. She had normal electrolytes and previously had normal thyroid function. Her hemoglobin was 12.4 hematocrit 37.4 She also has noted some shortness of breath.    She has a history of GERD and has been on chronic omeprazole 20 mg.  Recently she's noticed increasing heartburn symptoms.  There is mild shortness of breath with activity.  Her last stress test was 5-1/2 years ago.  She has seen Dr.Yan for imbalance issues and ultimately was referred to Dr. Dimas Millin in [place] for this. Since I last saw her, she tells me she was taken off her statin therapy by Dr. Dimas Millin due to concerns of hip osteopenia.  However, she is still on proton pump inhibition,  which has not been altered and may affect calcium was stasis.  She underwent a nuclear perfusion study in October 2015 which was low risk and showed a small, mild intensity fixed anterior defect consistent with soft tissue attenuation without scar or ischemia.  She had normal wall motion and LV function.  Ejection fraction was 68%.    The past 6 months, she's been without chest pain.  She denies shortness of breath.  She has been on Coumadin anticoagulation.  A 9 are done at [facility] was 3.3.  I reviewed recent laboratory from 11 days ago.  She has been off statin therapy.  Last year wall on atorvastatin.  Her total cholesterol was 177 and her most recent laboratory reveals this has increased to 274.  Her triglycerides have increased from 189 to  311.  LDL has increased from 96 to177.   Past Surgical History  Procedure Laterality Date  . Abdominal hysterectomy    . Rectocele repair    . Lymph nodes      left groin lymph node biopsies for benign reasons  . Left femoral hernia repair      Allergies  Allergen Reactions  . Sulfur Nausea Only  . Zithromax [Azithromycin] Nausea And Vomiting  . Amoxapine And Related     Current Outpatient Prescriptions  Medication Sig Dispense Refill  . Calcium Carbonate-Vit D-Min (CALCIUM 1200 PO) Take 1 tablet by mouth 2 (two) times daily.    . chlorhexidine (PERIDEX) 0.12 % solution Rinse as directed    .  HYDROcodone-acetaminophen (NORCO/VICODIN) 5-325 MG per tablet Take 1 tablet by mouth as needed.    . iron polysaccharides (NIFEREX) 150 MG capsule Take 1 capsule (150 mg total) by mouth daily. 30 capsule 11  . pantoprazole (PROTONIX) 40 MG tablet Take 1 tablet (40 mg total) by mouth daily. 90 tablet 1  . polyethylene glycol (MIRALAX / GLYCOLAX) packet Take 17 g by mouth daily.    . sotalol (BETAPACE) 120 MG tablet Take 1 tablet (120 mg total) by mouth 2 (two) times daily. 180 tablet 1  . temazepam (RESTORIL) 15 MG capsule Take 1 capsule (15 mg total) by  mouth at bedtime as needed for sleep. 10 capsule 0  . warfarin (COUMADIN) 5 MG tablet Take 1 tablet by mouth daily or as directed by coumadin clinic 90 tablet 1  . ezetimibe (ZETIA) 10 MG tablet Take 1 tablet (10 mg total) by mouth daily. 90 tablet 3  . Icosapent Ethyl (VASCEPA) 1 G CAPS Take 2 capsules by mouth 2 (two) times daily. 360 capsule 3   No current facility-administered medications for this visit.    Social history is notable in that she is married and has 3 children 4 grandchildren. He is no history of tobacco use. She has worked Education officer, environmental.  Family History  Problem Relation Age of Onset  . Heart failure Mother   . Heart failure Father   . Diabetes Father    ROS General: Negative; No fevers, chills, or night sweats;  HEENT: Negative; No changes in vision or hearing, sinus congestion, difficulty swallowing Pulmonary: Negative; No cough, wheezing, shortness of breath, hemoptysis Cardiovascular: Negative; No chest pain, presyncope, syncope, palpitations GI: Positive for GERD and diverticular disease; No nausea, vomiting, diarrhea, or abdominal pain GU: Negative; No dysuria, hematuria, or difficulty voiding Musculoskeletal: Positive for osteopenia and fractures of the vertebrae in her back Hematologic/Oncology: Negative; no easy bruising, bleeding Endocrine: Negative; no heat/cold intolerance; no diabetes Neuro: Negative; no changes in balance, headaches Skin: Negative; No rashes or skin lesions Psychiatric: Negative; No behavioral problems, depression Sleep: Negative; No snoring, daytime sleepiness, hypersomnolence, bruxism, restless legs, hypnogognic hallucinations, no cataplexy Other comprehensive 14 point system review is negative.   PE BP 132/84 mmHg  Pulse 56  Ht $R'5\' 4"'kr$  (1.626 m)  Wt 140 lb 1.6 oz (63.549 kg)  BMI 24.04 kg/m2  Blood pressure when taken by me was 116/78   Wt Readings from Last 3 Encounters:  12/24/14 140 lb 1.6 oz (63.549 kg)    07/22/14 142 lb 3.2 oz (64.501 kg)  07/15/14 143 lb (64.864 kg)   General: Alert, oriented, no distress.  Skin: normal turgor, no rashes HEENT: Normocephalic, atraumatic. Pupils round and reactive; sclera anicteric; no nystagmus; extraocular muscles are full Fundi  no AV nicking or arteriolar narrowing. No hemorrhages or exudates.  Nose without nasal septal hypertrophy Mouth/Parynx benign; Mallinpatti scale 3 Neck: No JVD, no carotid bruits; normal carotid upstroke. No bisferens pulse Lungs: clear to ausculatation and percussion; no wheezing or rales Chest wall: without tenderness to palpitation Heart: RRR, s1 s2 normal 6-9/7 systolic murmur left sternal border; no diastolic murmur.  No rubs, thrills or heaves. Abdomen: soft, nontender; no hepatosplenomehaly, BS+; abdominal aorta nontender and not dilated by palpation. Back: no CVA tenderness Pulses 2+ Extremities: no clubbing cyanosis or edema, Homan's sign negative  Neurologic: grossly nonfocal; Cranial nerves grossly wnl, Specifically, she had a fairly normal finger to nose test. She also had fairly normal rapid alternating movements. Psychologic: Normal mood and  affect  ECG (independently read by me): Sinus bradycardia 56 bpm.  Normal intervals.  No ectopy.  December 2015 ECG (independently read by me): Sinus bradycardia 51 bpm with sinus arrhythmia and APC.  QTc interval 431 ms.  October 2015 ECG (independently read by me): Sinus bradycardia 55 beats per minute.  QTc interval 440 ms.  No ST segment changes.  December 2014 ECG: Normal sinus rhythm at 50. PR interval 176 ms, QTc interval 430 ms. No significant ST-T changes.  LABS: BMP Latest Ref Rng 12/13/2014 05/13/2014 06/26/2013  Glucose 70 - 99 mg/dL 108(H) 106(H) 98  BUN 6 - 23 mg/dL $Remove'17 16 17  'eKQLeFK$ Creatinine 0.50 - 1.10 mg/dL 0.65 0.65 0.60  Sodium 135 - 145 mEq/L 136 140 138  Potassium 3.5 - 5.3 mEq/L 4.4 5.3 4.7  Chloride 96 - 112 mEq/L 102 106 101  CO2 19 - 32 mEq/L $Remove'24 28 28   'tKSSHbG$ Calcium 8.4 - 10.5 mg/dL 9.6 9.3 9.9   Hepatic Function Latest Ref Rng 12/13/2014 05/13/2014 06/26/2013  Total Protein 6.0 - 8.3 g/dL 6.6 6.5 6.7  Albumin 3.5 - 5.2 g/dL 4.1 4.2 4.3  AST 0 - 37 U/L $Remo'20 18 23  'ZsKss$ ALT 0 - 35 U/L 21 17 33  Alk Phosphatase 39 - 117 U/L 66 61 82  Total Bilirubin 0.2 - 1.2 mg/dL 0.4 0.4 0.5   CBC Latest Ref Rng 12/13/2014 05/13/2014 06/26/2013  WBC 4.0 - 10.5 K/uL 5.4 5.3 6.6  Hemoglobin 12.0 - 15.0 g/dL 13.1 12.7 12.4  Hematocrit 36.0 - 46.0 % 39.5 37.9 37.4  Platelets 150 - 400 K/uL 274 250 284   Lab Results  Component Value Date   MCV 90.4 12/13/2014   MCV 91.1 05/13/2014   MCV 89.3 06/26/2013   Lab Results  Component Value Date   TSH 1.442 12/13/2014  No results found for: HGBA1C  Lipid Panel     Component Value Date/Time   CHOL 274* 12/13/2014 0823   TRIG 311* 12/13/2014 0823   HDL 35* 12/13/2014 0823   CHOLHDL 7.8 12/13/2014 0823   VLDL 62* 12/13/2014 0823   LDLCALC 177* 12/13/2014 0823     RADIOLOGY: No results found.   ASSESSMENT AND PLAN: Ms. Wachob is a 74 year old female with history of sick sinus syndrome and paroxysmal atrial fibrillation.  She has been maintaining normal sinus rhythm and is tolerating sotalol 120 mg twice a day.  Her QTc interval is normal at 438 ms.   Her GERD has improved with Protonix.  She was taken off statin therapy by Dr. Dimas Millin.  Her lipid studies were reviewed with her in detail and these reveal significant worsening of her lipids.  At this point, I will not institute a trial of additional statin therapy without approval from Dr. Dimas Millin.  I will start Zetia 10 mg.  I will also add a separate 2 capsules twice a day.  We discussed strict diet compliance with reference to reduction in carbohydrates and sweets.  Laboratory will be obtained in 3 months.  It may be possible to consider livalo as an alternative statin in the future if her follow-up studies are not significant improved.  She's not had any further atrial  fibrillation and is maintaining sinus rhythm.  INR is 3.3 on current dose of warfarin.  She will contact [facility] physicians for potential dose adjustment if necessary.  I will see her in 4 months for reevaluation.  Time spent: 25 min   Troy Sine, MD, [facility] 12/26/2014 7:17 PM

## 2014-12-30 ENCOUNTER — Telehealth: Payer: Self-pay | Admitting: Cardiovascular Disease

## 2014-12-30 NOTE — Telephone Encounter (Signed)
Spoke with patient. She states she had a phone call from [facility] and they left a VM and she erased the message and there was some 7 digit code in the message she needed.   Provided patient with another number to [facility] [phone] and she was advised to contact them. She is concerned about the cost of the medication and that they may have already shipped it to her.

## 2014-12-30 NOTE — Telephone Encounter (Signed)
Please call after 2,concerning her new cholesterol medicine(she did not know the name of it).

## 2014-12-31 ENCOUNTER — Telehealth: Payer: Self-pay | Admitting: Cardiovascular Disease

## 2014-12-31 NOTE — Telephone Encounter (Signed)
Pt called in requesting the generic form of the Zetia and Vascepa to be [facility]. She says the cost of one medications cost $500 and the other was $130. Please f/u with the pt  Thanks

## 2014-12-31 NOTE — Telephone Encounter (Signed)
Called patient - she cannot afford the medications she was prescribed (Vascepa 2g BID, Zetia 10mg  DAILY) - needing alternative medications.  Routing to Dr. Tresa EndoKelly to advise.

## 2015-01-01 MED ORDER — EZETIMIBE 10 MG PO TABS: 10.0000 mg | ORAL_TABLET | Freq: Every day | ORAL | Status: DC

## 2015-01-01 NOTE — Telephone Encounter (Signed)
Pt said she was told to call back and ask for one of the triage nurse. She wants to know if you can find generic for her Zetia and Vascepa. Pt was so upset,she was crying.

## 2015-01-01 NOTE — Telephone Encounter (Signed)
Spoke to patient, who now does not recall which medication was going to cost $500 and which was $130. Does not know if this is insurance deductible related. Thinks that the Zetia is the more expensive one but unsure.  I explained no alternative to Zetia - and in fact upon review of her chart, she will need Atorvastatin 40mg  daily added to her regimen per lab result note by Dr. Tresa EndoKelly.  She is amenable also to taking Lovaza in place of Vascepa, however, could not make up her mind on which pharmacy [facility] she wants to send Rx to. Advised I can send any rx to any pharmacy of her choice - she will call back today or tomorrow and inform of her decision.

## 2015-01-01 NOTE — Telephone Encounter (Signed)
Spoke with patient and she would [facility] She states it is cheaper with [facility] vs local pharmacy   She cannot afford vascepa - it is $235 at local pharmacy and [facility]577 [facility] She will contact her pharmacies to check on lovaza cost and call back

## 2015-01-01 NOTE — Telephone Encounter (Signed)
Try lovaza 2 cap bid; no alternative for zetia

## 2015-01-03 ENCOUNTER — Telehealth: Payer: Self-pay | Admitting: Cardiovascular Disease

## 2015-01-03 NOTE — Telephone Encounter (Signed)
Spoke to [place] from [place] She states that patient is tearful that her medication cost so much and she can not afford the vascepa or lovaza  Westly Pam states that the Vascepa cost $800 to the patient.  Fenofibrate 160 mg is on her formulary - tier 1. - Westly Pam wanted Dr Tresa Endo to be aware. RN informed her that information will be routed to Dr Tresa Endo for his review. Patient will be contacted and prescription will be e-sent if needed

## 2015-01-05 NOTE — Telephone Encounter (Signed)
Can try fenofibrate 160 mg and OTC fish oil

## 2015-01-06 NOTE — Telephone Encounter (Signed)
Spoke to patient  She is aware.Will send new prescription to [facility]. Continue with OTC  FISH OIL  Twice a day Patient states [facility] has not mailed Zetia,yet. She would like both to sent. RN informed patient will contact [facility]  RN called [facility]- left message for Westly Pam to call back

## 2015-01-07 NOTE — Telephone Encounter (Signed)
Can this encounter be closed?

## 2015-01-09 MED ORDER — FENOFIBRATE 160 MG PO TABS: 160.0000 mg | ORAL_TABLET | Freq: Every day | ORAL | Status: DC

## 2015-01-09 NOTE — Telephone Encounter (Signed)
E-SENT R FOR FENOFIBRATE 160 MG QD.

## 2015-01-14 ENCOUNTER — Telehealth: Payer: Self-pay | Admitting: Cardiovascular Disease

## 2015-01-14 NOTE — Telephone Encounter (Signed)
LM that medication was refilled on 6/16.

## 2015-01-14 NOTE — Telephone Encounter (Signed)
°  1. Which medications need to be refilled? Fenofibrate 160mg    2. Which pharmacy is medication to be sent to?[facility] pharmacy   3. Do they need a 30 day or 90 day supply? 90  4. Would they like a call back once the medication has been sent to the pharmacy? Yes

## 2015-01-20 ENCOUNTER — Telehealth: Payer: Self-pay | Admitting: Cardiovascular Disease

## 2015-01-20 NOTE — Telephone Encounter (Signed)
acknowledged

## 2015-01-20 NOTE — Telephone Encounter (Signed)
Spoke to patient She states she did order a 3 month supply of ZETIA  BUT WILL NOT REFILL BECAUSE IT WAS OVER $100. RN informed patient that Dr Tresa EndoKelly was under the impression she waS not able to get ZETIA. She will take zetia and fish oil until finish the zetia bottle then start fenofibrate and fish oil.  Will send to Dr Tresa EndoKelly for further review and contact patient with instruction

## 2015-01-20 NOTE — Telephone Encounter (Signed)
Pt wants to know if she needs to take Zetia,Fenofibrate and fish oil? All three of these are for cholesterol,she did not know if she need all of them.

## 2015-01-20 NOTE — Telephone Encounter (Signed)
Patient aware to complete zetia then start fenofibrate.

## 2015-02-14 ENCOUNTER — Other Ambulatory Visit: Payer: Self-pay | Admitting: Cardiovascular Disease

## 2015-02-14 NOTE — Telephone Encounter (Signed)
Rx(s) sent to pharmacy electronically. Warfarin refused - patient no longer sees Phillips Hay, PharmD, for INR checks.

## 2015-02-18 ENCOUNTER — Ambulatory Visit: Payer: Self-pay | Admitting: Pharmacist Clinician (PhC)/ Clinical Pharmacy Specialist

## 2015-02-18 DIAGNOSIS — I4891 Unspecified atrial fibrillation: Secondary | ICD-10-CM

## 2015-02-18 DIAGNOSIS — Z7901 Long term (current) use of anticoagulants: Secondary | ICD-10-CM

## 2015-03-28 LAB — LIPID PANEL
Cholesterol: 213 mg/dL — ABNORMAL HIGH (ref 125–200)
HDL: 44 mg/dL — AB (ref >=46)
LDL CALC: 138 mg/dL — AB (ref <130)
Total CHOL/HDL Ratio: 4.8 Ratio (ref <=5.0)
Triglycerides: 156 mg/dL — ABNORMAL HIGH (ref <150)
VLDL: 31 mg/dL — ABNORMAL HIGH (ref <30)

## 2015-03-28 LAB — COMPREHENSIVE METABOLIC PANEL
ALK PHOS: 64 U/L (ref 33–130)
ALT: 24 U/L (ref 6–29)
AST: 21 U/L (ref 10–35)
Albumin: 4.2 g/dL (ref 3.6–5.1)
BILIRUBIN TOTAL: 0.4 mg/dL (ref 0.2–1.2)
BUN: 25 mg/dL (ref 7–25)
CO2: 28 mmol/L (ref 20–31)
CREATININE: 0.62 mg/dL (ref 0.60–0.93)
Calcium: 9.3 mg/dL (ref 8.6–10.4)
Chloride: 103 mmol/L (ref 98–110)
Glucose, Bld: 108 mg/dL — ABNORMAL HIGH (ref 65–99)
Potassium: 4.8 mmol/L (ref 3.5–5.3)
SODIUM: 139 mmol/L (ref 135–146)
Total Protein: 6.5 g/dL (ref 6.1–8.1)

## 2015-04-01 ENCOUNTER — Telehealth: Payer: Self-pay | Admitting: Cardiovascular Disease

## 2015-04-01 NOTE — Telephone Encounter (Signed)
No answer, left VM.  Results reviewed, Dr. Tresa Endo wants to start patient on  Livalo daily. Will confirm preferred pharmacy w/ patient before sending.

## 2015-04-01 NOTE — Telephone Encounter (Signed)
Pt wants her lab results from last week mailed to her  please.She also want you to fax it to Carley,Nutrionist at [facility] at [phone].Also wants to talk to the nurse about her Cholesterol medicine.

## 2015-04-02 NOTE — Telephone Encounter (Signed)
Patient would like to finish her 3 months of fenofibrate she just received in the mail if that is ok before starting the [facility]

## 2015-04-03 NOTE — Telephone Encounter (Signed)
Reviewed patients medications.  To ask Dr. Tresa Endo if he wants patient to on both Livalo 1 mg daily and fenofobrate 160 mg a day.

## 2015-04-04 NOTE — Telephone Encounter (Signed)
-----   Message from Lennette Bihari, MD sent at 03/31/2015  5:40 PM EDT ----- Lipids better with zetia; was on atorva in past bu stopped ? myalgias; try livalo 1 mg

## 2015-04-04 NOTE — Telephone Encounter (Signed)
Patient notified of lab results and recommendations. She would like to wait to speak with dr. Tresa Endo at her 04/25/15 OV before making any changes.

## 2015-04-06 NOTE — Telephone Encounter (Signed)
ok 

## 2015-04-25 ENCOUNTER — Encounter: Payer: Self-pay | Admitting: Cardiovascular Disease

## 2015-04-25 ENCOUNTER — Ambulatory Visit (INDEPENDENT_AMBULATORY_CARE_PROVIDER_SITE_OTHER): Payer: Commercial Managed Care - HMO | Admitting: Cardiovascular Disease

## 2015-04-25 VITALS — BP 116/70 | HR 53 | Ht 64.0 in | Wt 136.8 lb

## 2015-04-25 DIAGNOSIS — Z79899 Other long term (current) drug therapy: Secondary | ICD-10-CM | POA: Diagnosis not present

## 2015-04-25 DIAGNOSIS — I480 Paroxysmal atrial fibrillation: Secondary | ICD-10-CM

## 2015-04-25 DIAGNOSIS — R2689 Other abnormalities of gait and mobility: Secondary | ICD-10-CM

## 2015-04-25 DIAGNOSIS — Z7901 Long term (current) use of anticoagulants: Secondary | ICD-10-CM

## 2015-04-25 DIAGNOSIS — E781 Pure hyperglyceridemia: Secondary | ICD-10-CM

## 2015-04-25 DIAGNOSIS — I4891 Unspecified atrial fibrillation: Secondary | ICD-10-CM | POA: Diagnosis not present

## 2015-04-25 DIAGNOSIS — I340 Nonrheumatic mitral (valve) insufficiency: Secondary | ICD-10-CM

## 2015-04-25 DIAGNOSIS — E785 Hyperlipidemia, unspecified: Secondary | ICD-10-CM

## 2015-04-25 DIAGNOSIS — R29818 Other symptoms and signs involving the nervous system: Secondary | ICD-10-CM

## 2015-04-25 NOTE — Patient Instructions (Signed)
Your physician recommends that you return for lab work in 3 months FASTING  Your physician recommends that you schedule a follow-up appointment in 4 months

## 2015-04-27 ENCOUNTER — Encounter: Payer: Self-pay | Admitting: Cardiovascular Disease

## 2015-04-27 NOTE — Progress Notes (Signed)
Patient ID: Julie Kemp, female   DOB: Feb 07, 1941, 74 y.o.   MRN: [id number]     HPI: Julie Kemp  is a 74 year old is a former patient of Dr. Rollene Fare who established care with me after his retirement.  I last saw her 39months ago and she presents for follow-up evaluation  Julie Kemp has a history of sick sinus syndrome with PAF and has been on chronic sotalol therapy. She also has a history of anemia and diverticular disease. She has been on chronic Coumadin anticoagulation therapy. An echo Doppler study in August 2014 showed mild left ventricular hypertrophy with ejection fraction of 55-60% without regional wall motion abnormalities. She did have mitral annular calcification with moderate mitral regurgitation directed centrally. She had mild-to-moderate LA dilatation and mild RA dilatation. In 2010 a nuclear perfusion study showed fairly normal perfusion. In May 2014 she underwent carotid studies which were essentially normal.  When I initially saw her in October 2015 she was noticing more  palpitations that would often last up to 30 seconds. At that time, I recommended that she titrate her sotalol from 120 mg in the morning and 80 mg at night to 120 mg twice a day. Laboratory demonstrated a normal magnesium level at 2.3. She had normal electrolytes and previously had normal thyroid function. Her hemoglobin was 12.4 hematocrit 37.4 She also has noted some shortness of breath.    She has a history of GERD and has been on chronic omeprazole 20 mg.  Recently she's noticed increasing heartburn symptoms.  There is mild shortness of breath with activity.  Her last stress test was 5-1/2 years ago.  She has seen Dr.Yan for imbalance issues and ultimately was referred to Dr. Dimas Millin in [place]. Since I last saw her, she tells me she was taken off her statin therapy by Dr. Dimas Millin due to concerns of hip osteopenia.  However, she is still on proton pump inhibition, which has not been altered and may  affect calcium was stasis.  She underwent a nuclear perfusion study in October 2015 which was low risk and showed a small, mild intensity fixed anterior defect consistent with soft tissue attenuation without scar or ischemia.  She had normal wall motion and LV function.  Ejection fraction was 68%.    Last year, when she was on atorvastatin her total cholesterol was 177 and when I last saw her when she was off statin therapy cholesterol had increased to 274.  Her triglycerides have increased from 189 to  311.  LDL has increased from 96 to177.  She does note some occasional episodes of palpitations.  She is on sotalol 120 mg twice a day.  She also is on iron supplementation. She had been back on Zetia but again, stopped taking Zetia 3 weeks ago  And has been on fenofibrate and the counter fish oil capsules. We had given her samples of Vascepa, but she did not fill the prescription due to cost she continues to be on anticoagulation.   He presents for evaluation   Past Surgical History  Procedure Laterality Date  . Abdominal hysterectomy    . Rectocele repair    . Lymph nodes      left groin lymph node biopsies for benign reasons  . Left femoral hernia repair      Allergies  Allergen Reactions  . Sulfur Nausea Only  . Zithromax [Azithromycin] Nausea And Vomiting  . Amoxapine And Related     Current Outpatient Prescriptions  Medication Sig Dispense Refill  .  Calcium Carbonate-Vit D-Min (CALCIUM 1200 PO) Take 1 tablet by mouth 2 (two) times daily.    . chlorhexidine (PERIDEX) 0.12 % solution Rinse as directed    . fenofibrate 160 MG tablet Take 1 tablet (160 mg total) by mouth daily. 90 tablet 3  . HYDROcodone-acetaminophen (NORCO/VICODIN) 5-325 MG per tablet Take 1 tablet by mouth as needed.    . iron polysaccharides (NIFEREX) 150 MG capsule Take 1 capsule (150 mg total) by mouth daily. 30 capsule 11  . Omega-3 Fatty Acids (FISH OIL PO) Take 2 capsules by mouth 2 (two) times daily.     .  pantoprazole (PROTONIX) 40 MG tablet TAKE 1 TABLET (40 MG TOTAL) BY MOUTH DAILY. 90 tablet 3  . polyethylene glycol (MIRALAX / GLYCOLAX) packet Take 17 g by mouth daily.    . sotalol (BETAPACE) 120 MG tablet TAKE 1 TABLET TWICE DAILY 180 tablet 1  . temazepam (RESTORIL) 15 MG capsule Take 1 capsule (15 mg total) by mouth at bedtime as needed for sleep. 10 capsule 0  . warfarin (COUMADIN) 5 MG tablet Take 1 tablet by mouth daily or as directed by coumadin clinic 90 tablet 1   No current facility-administered medications for this visit.    Social history is notable in that she is married and has 3 children 4 grandchildren. He is no history of tobacco use. She has worked Education officer, environmental.  Family History  Problem Relation Age of Onset  . Heart failure Mother   . Heart failure Father   . Diabetes Father    ROS General: Negative; No fevers, chills, or night sweats;  HEENT: Negative; No changes in vision or hearing, sinus congestion, difficulty swallowing Pulmonary: Negative; No cough, wheezing, shortness of breath, hemoptysis Cardiovascular: Negative; No chest pain, presyncope, syncope, palpitations GI: Positive for GERD and diverticular disease; No nausea, vomiting, diarrhea, or abdominal pain GU: Negative; No dysuria, hematuria, or difficulty voiding Musculoskeletal: Positive for osteopenia and fractures of the vertebrae in her back Hematologic/Oncology: Negative; no easy bruising, bleeding Endocrine: Negative; no heat/cold intolerance; no diabetes Neuro: Negative; no changes in balance, headaches Skin: Negative; No rashes or skin lesions Psychiatric: Negative; No behavioral problems, depression Sleep: Negative; No snoring, daytime sleepiness, hypersomnolence, bruxism, restless legs, hypnogognic hallucinations, no cataplexy Other comprehensive 14 point system review is negative.   PE BP 116/70 mmHg  Pulse 53  Ht $R'5\' 4"'tv$  (1.626 m)  Wt 136 lb 12.8 oz (62.052 kg)  BMI 23.47  kg/m2  Blood pressure when taken by me was 116/74  Wt Readings from Last 3 Encounters:  04/25/15 136 lb 12.8 oz (62.052 kg)  12/24/14 140 lb 1.6 oz (63.549 kg)  07/22/14 142 lb 3.2 oz (64.501 kg)   General: Alert, oriented, no distress.  Skin: normal turgor, no rashes HEENT: Normocephalic, atraumatic. Pupils round and reactive; sclera anicteric; no nystagmus; extraocular muscles are full Fundi  no AV nicking or arteriolar narrowing. No hemorrhages or exudates.  Nose without nasal septal hypertrophy Mouth/Parynx benign; Mallinpatti scale 3 Neck: No JVD, no carotid bruits; normal carotid upstroke. No bisferens pulse Lungs: clear to ausculatation and percussion; no wheezing or rales Chest wall: without tenderness to palpitation Heart: RRR, s1 s2 normal 0-2/5 systolic murmur left sternal border; no diastolic murmur.  No rubs, thrills or heaves. Abdomen: soft, nontender; no hepatosplenomehaly, BS+; abdominal aorta nontender and not dilated by palpation. Back: no CVA tenderness Pulses 2+ Extremities: no clubbing cyanosis or edema, Homan's sign negative  Neurologic: grossly nonfocal; Cranial nerves grossly  wnl, Specifically, she had a fairly normal finger to nose test. She also had fairly normal rapid alternating movements. Psychologic: Normal mood and affect  ECG (independently read by me):  Sinus bradycardia 53 bpm.  No ectopy.  QTc interval 431 ms.  May 2016ECG (independently read by me): Sinus bradycardia 56 bpm.  Normal intervals.  No ectopy.  December 2015 ECG (independently read by me): Sinus bradycardia 51 bpm with sinus arrhythmia and APC.  QTc interval 431 ms.  October 2015 ECG (independently read by me): Sinus bradycardia 55 beats per minute.  QTc interval 440 ms.  No ST segment changes.  December 2014 ECG: Normal sinus rhythm at 50. PR interval 176 ms, QTc interval 430 ms. No significant ST-T changes.  LABS: BMP Latest Ref Rng 03/27/2015 12/13/2014 05/13/2014  Glucose 65 - 99  mg/dL 108(H) 108(H) 106(H)  BUN 7 - 25 mg/dL $Remove'25 17 16  'bihfnRn$ Creatinine 0.60 - 0.93 mg/dL 0.62 0.65 0.65  Sodium 135 - 146 mmol/L 139 136 140  Potassium 3.5 - 5.3 mmol/L 4.8 4.4 5.3  Chloride 98 - 110 mmol/L 103 102 106  CO2 20 - 31 mmol/L $RemoveB'28 24 28  'RbAccEFo$ Calcium 8.6 - 10.4 mg/dL 9.3 9.6 9.3   Hepatic Function Latest Ref Rng 03/27/2015 12/13/2014 05/13/2014  Total Protein 6.1 - 8.1 g/dL 6.5 6.6 6.5  Albumin 3.6 - 5.1 g/dL 4.2 4.1 4.2  AST 10 - 35 U/L $Remo'21 20 18  'tXIgH$ ALT 6 - 29 U/L $Remo'24 21 17  'UdExx$ Alk Phosphatase 33 - 130 U/L 64 66 61  Total Bilirubin 0.2 - 1.2 mg/dL 0.4 0.4 0.4   CBC Latest Ref Rng 12/13/2014 05/13/2014 06/26/2013  WBC 4.0 - 10.5 K/uL 5.4 5.3 6.6  Hemoglobin 12.0 - 15.0 g/dL 13.1 12.7 12.4  Hematocrit 36.0 - 46.0 % 39.5 37.9 37.4  Platelets 150 - 400 K/uL 274 250 284   Lab Results  Component Value Date   MCV 90.4 12/13/2014   MCV 91.1 05/13/2014   MCV 89.3 06/26/2013   Lab Results  Component Value Date   TSH 1.442 12/13/2014  No results found for: HGBA1C   Lipid Panel     Component Value Date/Time   CHOL 213* 03/27/2015 0823   TRIG 156* 03/27/2015 0823   HDL 44* 03/27/2015 0823   CHOLHDL 4.8 03/27/2015 0823   VLDL 31* 03/27/2015 0823   LDLCALC 138* 03/27/2015 0823     RADIOLOGY: No results found.   ASSESSMENT AND PLAN: Ms. Kiger is a 74 year old female with history of sick sinus syndrome and paroxysmal atrial fibrillation.  She has been maintaining normal sinus rhythm and is tolerating sotalol 120 mg twice a day.  Her QTc interval is normal at 431 ms today.   Her GERD has improved with Protonix.   She had been taken off statin therapy by Dr. Dimas Millin and her lipids had significantly increased. Recently, she had been on Zetia but could not afford this due to cost.  She is now on fenofibrate and over-the-counter fish oil. Her triglycerides have improved to 156 from being previously elevated at 311. Her LDL remains elevated at 138. She continues to have difficulty with balance  issues.  She tells me she recently had an MRI.  She has undergone neurologic evaluation.  She also has had physical therapy. She will also be seeing a nutritionist, which is scheduled for 04/28/2015.  Her ECG shows sinus bradycardia.  She is not having orthostatic symptoms.  Repeat lipid studies and chemistry profile will be obtained  in 3 months.  I will see her in 6 months for reevaluation.  Time spent: 25 min  Troy Sine, MD, [facility] 04/27/2015 11:04 AM

## 2015-05-16 ENCOUNTER — Ambulatory Visit: Payer: Medicare HMO | Admitting: Neurology

## 2015-06-16 ENCOUNTER — Telehealth: Payer: Self-pay | Admitting: Cardiovascular Disease

## 2015-06-16 MED ORDER — SOTALOL HCL 120 MG PO TABS: 120.0000 mg | ORAL_TABLET | Freq: Two times a day (BID) | ORAL | Status: DC

## 2015-06-16 NOTE — Telephone Encounter (Signed)
New scrptt sent to the pharmacy

## 2015-06-16 NOTE — Telephone Encounter (Signed)
[name] is calling because [facility] that a prescription(Sotolol 120mg  )  90 day supply  needs to be fax in to them at [phone].Marland Kitchen. Please call if you have any questions   Thanks

## 2015-07-01 ENCOUNTER — Telehealth: Payer: Self-pay | Admitting: Cardiovascular Disease

## 2015-07-01 ENCOUNTER — Other Ambulatory Visit: Payer: Self-pay | Admitting: Cardiovascular Disease

## 2015-07-01 NOTE — Telephone Encounter (Signed)
agree

## 2015-07-01 NOTE — Telephone Encounter (Signed)
Patient got a new Fish oil and dose is 1200 mg dose and does not say cholesterol free (bottle has 15 mg of cholesterol listed) Current dose of fish oil is 1000 mg 2 twice a day Discussed with Penni BombardKristen Pharm D and ok to take the 1200 mg 2 twice a day as before. In future recommended brand is Ashby Dawesature made Advised patient but she wanted to make sure Dr Tresa EndoKelly agreed  Will forward to Dr Tresa EndoKelly for review

## 2015-07-01 NOTE — Telephone Encounter (Signed)
°*  STAT* If patient is at the pharmacy, call can be transferred to refill team.   1. Which medications need to be refilled? (please list name of each medication and dose if known)Temazepam  2. Which pharmacy/location (including street and city if local pharmacy) is medication to be sent to?[facility] [phone]  3. Do they need a 30 day or 90 day supply? 10

## 2015-07-01 NOTE — Telephone Encounter (Signed)
Dr Tresa EndoKelly will not refill this medication. Patient notified and advised to contact PCP for refills.

## 2015-07-01 NOTE — Telephone Encounter (Signed)
Please call,question about how to take her new dose of Nature Bonuty Fish Oil Capsules.

## 2015-07-02 NOTE — Telephone Encounter (Signed)
Advised patient, verbalized understanding  

## 2015-07-31 ENCOUNTER — Other Ambulatory Visit: Payer: Self-pay | Admitting: *Deleted

## 2015-07-31 DIAGNOSIS — Z7901 Long term (current) use of anticoagulants: Secondary | ICD-10-CM | POA: Diagnosis not present

## 2015-07-31 DIAGNOSIS — J209 Acute bronchitis, unspecified: Secondary | ICD-10-CM | POA: Diagnosis not present

## 2015-07-31 DIAGNOSIS — J0100 Acute maxillary sinusitis, unspecified: Secondary | ICD-10-CM | POA: Diagnosis not present

## 2015-08-12 DIAGNOSIS — E785 Hyperlipidemia, unspecified: Secondary | ICD-10-CM | POA: Diagnosis not present

## 2015-08-12 DIAGNOSIS — Z79899 Other long term (current) drug therapy: Secondary | ICD-10-CM | POA: Diagnosis not present

## 2015-08-13 LAB — LIPID PANEL
CHOL/HDL RATIO: 4.2 ratio (ref <=5.0)
Cholesterol: 202 mg/dL — ABNORMAL HIGH (ref 125–200)
HDL: 48 mg/dL (ref >=46)
LDL CALC: 131 mg/dL — AB (ref <130)
TRIGLYCERIDES: 113 mg/dL (ref <150)
VLDL: 23 mg/dL (ref <30)

## 2015-08-13 LAB — COMPREHENSIVE METABOLIC PANEL WITH GFR
ALT: 23 U/L (ref 6–29)
AST: 22 U/L (ref 10–35)
Albumin: 4 g/dL (ref 3.6–5.1)
Alkaline Phosphatase: 39 U/L (ref 33–130)
BUN: 27 mg/dL — ABNORMAL HIGH (ref 7–25)
CO2: 27 mmol/L (ref 20–31)
Calcium: 10.1 mg/dL (ref 8.6–10.4)
Chloride: 104 mmol/L (ref 98–110)
Creat: 0.69 mg/dL (ref 0.60–0.93)
Glucose, Bld: 86 mg/dL (ref 65–99)
Potassium: 4.9 mmol/L (ref 3.5–5.3)
Sodium: 140 mmol/L (ref 135–146)
Total Bilirubin: 0.4 mg/dL (ref 0.2–1.2)
Total Protein: 6.2 g/dL (ref 6.1–8.1)

## 2015-08-28 ENCOUNTER — Telehealth: Payer: Self-pay | Admitting: *Deleted

## 2015-08-28 ENCOUNTER — Telehealth: Payer: Self-pay | Admitting: Cardiovascular Disease

## 2015-08-28 ENCOUNTER — Other Ambulatory Visit: Payer: Self-pay | Admitting: *Deleted

## 2015-08-28 DIAGNOSIS — Z79899 Other long term (current) drug therapy: Secondary | ICD-10-CM

## 2015-08-28 DIAGNOSIS — E785 Hyperlipidemia, unspecified: Secondary | ICD-10-CM

## 2015-08-28 MED ORDER — ROSUVASTATIN CALCIUM 10 MG PO TABS: ORAL_TABLET | ORAL | Status: DC

## 2015-08-28 NOTE — Telephone Encounter (Signed)
Patient notified of lab results and recommendations. She voiced he understanding.

## 2015-08-28 NOTE — Telephone Encounter (Signed)
-----   Message from Lennette Bihari, MD sent at 08/17/2015  1:17 PM EST ----- Try re-instituting statin with crestor 10 mg q2-3 days and if tolerates try to advance over the next month to daily; re-check lipids/lft's in 3 months

## 2015-08-29 ENCOUNTER — Telehealth: Payer: Self-pay | Admitting: Cardiovascular Disease

## 2015-08-29 NOTE — Telephone Encounter (Signed)
Re: crestor Returned call. Pt voiced grievances regarding price of Crestor (generic). Explained to pt that $50 for 30 day supply probably entirely reasonable considering the type of medication this is. Pt voiced disappointment, stated she would not fill the medication. Wanted an alternative. Wanted Burna Mortimer to return her call. Informed pt Burna Mortimer was unavailable, in clinic, but would be happy to let her know & she could call back when able. Pt voiced understanding.

## 2015-08-29 NOTE — Telephone Encounter (Signed)
Patient states genericcrestor was called in to her pharmacy yesterday and she went to pick it up and it was over $50.00.  She did not get it because it is too expensive.  What are her other options?

## 2015-09-01 DIAGNOSIS — Z7901 Long term (current) use of anticoagulants: Secondary | ICD-10-CM | POA: Diagnosis not present

## 2015-09-09 ENCOUNTER — Other Ambulatory Visit: Payer: Self-pay | Admitting: *Deleted

## 2015-09-09 MED ORDER — PANTOPRAZOLE SODIUM 40 MG PO TBEC: 40.0000 mg | DELAYED_RELEASE_TABLET | Freq: Every day | ORAL | Status: DC

## 2015-09-09 MED ORDER — FENOFIBRATE 160 MG PO TABS: 160.0000 mg | ORAL_TABLET | Freq: Every day | ORAL | Status: DC

## 2015-09-09 MED ORDER — SOTALOL HCL 120 MG PO TABS: 120.0000 mg | ORAL_TABLET | Freq: Two times a day (BID) | ORAL | Status: DC

## 2015-09-10 NOTE — Telephone Encounter (Signed)
Close encounter 

## 2015-09-29 DIAGNOSIS — Z7901 Long term (current) use of anticoagulants: Secondary | ICD-10-CM | POA: Diagnosis not present

## 2015-10-02 ENCOUNTER — Telehealth: Payer: Self-pay | Admitting: Cardiovascular Disease

## 2015-10-02 MED ORDER — PANTOPRAZOLE SODIUM 40 MG PO TBEC: 40.0000 mg | DELAYED_RELEASE_TABLET | Freq: Every day | ORAL | Status: DC

## 2015-10-02 MED ORDER — FENOFIBRATE 160 MG PO TABS: 160.0000 mg | ORAL_TABLET | Freq: Every day | ORAL | Status: DC

## 2015-10-02 MED ORDER — SOTALOL HCL 120 MG PO TABS: 120.0000 mg | ORAL_TABLET | Freq: Two times a day (BID) | ORAL | Status: DC

## 2015-10-02 NOTE — Telephone Encounter (Signed)
Meds sent w/ exception of coumadin - noted overdue INR check according to system, [facility].

## 2015-10-02 NOTE — Telephone Encounter (Signed)
Called pt, informed all other refills handled & to contact PCP or whoever does her coumadin dosing for the coumadin refill.

## 2015-10-02 NOTE — Telephone Encounter (Signed)
We don't follow INR readings, she'll have to have her PCP refill the warfarin

## 2015-10-02 NOTE — Telephone Encounter (Signed)
New Message  Pt stated that she has attempted to have her meds sent through [facility] a couple of times but has not received any folow up. Pt wanted Sotalol, warfarin, fenofibrate, and protonix sent through [facility] [phone] Pt requested to speak w/ RN. Please call back and discuss.

## 2015-10-07 ENCOUNTER — Other Ambulatory Visit: Payer: Self-pay | Admitting: Cardiovascular Disease

## 2015-10-08 NOTE — Telephone Encounter (Signed)
REFILL 

## 2015-10-15 DIAGNOSIS — Z1231 Encounter for screening mammogram for malignant neoplasm of breast: Secondary | ICD-10-CM | POA: Diagnosis not present

## 2015-10-28 ENCOUNTER — Telehealth: Payer: Self-pay | Admitting: *Deleted

## 2015-10-28 ENCOUNTER — Encounter: Payer: Self-pay | Admitting: Cardiovascular Disease

## 2015-10-28 ENCOUNTER — Ambulatory Visit (INDEPENDENT_AMBULATORY_CARE_PROVIDER_SITE_OTHER): Payer: Medicare Other | Admitting: Cardiovascular Disease

## 2015-10-28 VITALS — BP 110/76 | HR 49 | Ht 64.0 in | Wt 128.2 lb

## 2015-10-28 DIAGNOSIS — I480 Paroxysmal atrial fibrillation: Secondary | ICD-10-CM | POA: Diagnosis not present

## 2015-10-28 DIAGNOSIS — R079 Chest pain, unspecified: Secondary | ICD-10-CM

## 2015-10-28 DIAGNOSIS — R0789 Other chest pain: Secondary | ICD-10-CM | POA: Diagnosis not present

## 2015-10-28 DIAGNOSIS — K219 Gastro-esophageal reflux disease without esophagitis: Secondary | ICD-10-CM | POA: Diagnosis not present

## 2015-10-28 DIAGNOSIS — Z7901 Long term (current) use of anticoagulants: Secondary | ICD-10-CM | POA: Diagnosis not present

## 2015-10-28 MED ORDER — SOTALOL HCL 80 MG PO TABS: 80.0000 mg | ORAL_TABLET | Freq: Two times a day (BID) | ORAL | Status: DC

## 2015-10-28 NOTE — Telephone Encounter (Signed)
Left patient a message per Dr Tresa EndoKelly for now we he will not add a statin since she stated that her neurologist told her not to take statins. He will revisit it anote=her time.

## 2015-10-28 NOTE — Patient Instructions (Signed)
Your physician has requested that you have a lexiscan myoview. For further information please visit [email or web address]. Please follow instruction sheet, as given.   Your physician has recommended you make the following change in your medication:   1.) the sotalol has been decreased to 80 mg twice a day. A new prescription has been sent to your pharmacy.  Your physician recommends that you schedule a follow-up appointment in: 3 months with Dr.Kelly.

## 2015-10-29 ENCOUNTER — Encounter: Payer: Self-pay | Admitting: Cardiovascular Disease

## 2015-10-29 DIAGNOSIS — R079 Chest pain, unspecified: Secondary | ICD-10-CM | POA: Insufficient documentation

## 2015-10-29 NOTE — Progress Notes (Signed)
Patient ID: Julie Kemp, female   DOB: 1940-12-01, 75 y.o.   MRN: [id number]     HPI: Julie Kemp  is a 75 year old is a former patient of Dr. Rollene Fare who established care with me after his retirement.  She presents for follow-up evaluation  Julie Kemp has a history of sick sinus syndrome with PAF and has been on chronic sotalol therapy. She also has a history of anemia and diverticular disease. She has been on chronic Coumadin anticoagulation therapy. An echo Doppler study in August 2014 showed mild left ventricular hypertrophy with ejection fraction of 55-60% without regional wall motion abnormalities. She did have mitral annular calcification with moderate mitral regurgitation directed centrally. She had mild-to-moderate LA dilatation and mild RA dilatation. In 2010 a nuclear perfusion study showed fairly normal perfusion. In May 2014 she underwent carotid studies which were essentially normal.  When I initially saw her in October 2015 she was noticing more  palpitations that would often last up to 30 seconds. At that time, I recommended that she titrate her sotalol from 120 mg in the morning and 80 mg at night to 120 mg twice a day. Laboratory demonstrated a normal magnesium level at 2.3. She had normal electrolytes and previously had normal thyroid function. Her hemoglobin was 12.4 hematocrit 37.4 She also has noted some shortness of breath.    She has a history of GERD and has been on chronic omeprazole 20 mg.  Recently she's noticed increasing heartburn symptoms.  There is mild shortness of breath with activity.  Her last stress test was 5-1/2 years ago.  She has seen Dr.Yan for imbalance issues and ultimately was referred to Dr. Dimas Millin in [place]. She was taken off her statin therapy by Dr. Dimas Millin due to concerns of hip osteopenia.  However, she is still on proton pump inhibition, which has not been altered and may affect calcium was stasis.  A nuclear perfusion study in October  2015  was low risk and showed a small, mild intensity fixed anterior defect consistent with soft tissue attenuation without scar or ischemia.  She had normal wall motion and LV function.  Ejection fraction was 68%.    Last year, when she was on atorvastatin her total cholesterol was 177 and when I last saw her when she was off statin therapy cholesterol had increased to 274.  Her triglycerides have increased from 189 to  311.  LDL has increased from 96 to177.  She is on sotalol 120 mg twice a day.  She also is on iron supplementation. She had been back on Zetia but again, stopped taking Zetia. She is on fenofibrate and the counter fish oil capsules. We had given her samples of Vascepa, but she did not fill the prescription due to cost she continues to be on anticoagulation.   Recently, she has noticed development of some intermittent chest discomfort associated with mild shortness of breath.  She denies any palpitations.  She presents for evaluation.   Past Surgical History  Procedure Laterality Date  . Abdominal hysterectomy    . Rectocele repair    . Lymph nodes      left groin lymph node biopsies for benign reasons  . Left femoral hernia repair      Allergies  Allergen Reactions  . Sulfur Nausea Only  . Zithromax [Azithromycin] Nausea And Vomiting  . Amoxapine And Related     Current Outpatient Prescriptions  Medication Sig Dispense Refill  . Calcium Carbonate-Vit D-Min (CALCIUM 1200 PO) Take  1 tablet by mouth 2 (two) times daily.    . chlorhexidine (PERIDEX) 0.12 % solution Rinse as directed    . DimenhyDRINATE (DRAMAMINE PO) Take 6 tablets by mouth daily.    . fenofibrate 160 MG tablet Take 1 tablet (160 mg total) by mouth daily. 90 tablet 0  . FERREX 150 150 MG capsule TAKE 1 CAPSULE BY MOUTH ONCE DAILY 100 capsule 5  . Omega-3 Fatty Acids (FISH OIL PO) Take 2 capsules by mouth 2 (two) times daily. Taking Nature Bounty 1200 mg    . OVER THE COUNTER MEDICATION Take 1-2 capsules  by mouth daily. Neuroplex    . pantoprazole (PROTONIX) 40 MG tablet Take 1 tablet (40 mg total) by mouth daily. 90 tablet 0  . polyethylene glycol (MIRALAX / GLYCOLAX) packet Take 17 g by mouth daily.    . temazepam (RESTORIL) 15 MG capsule Take 1 capsule (15 mg total) by mouth at bedtime as needed for sleep. 10 capsule 0  . warfarin (COUMADIN) 5 MG tablet Take 1 tablet by mouth daily or as directed by coumadin clinic 90 tablet 1  . sotalol (BETAPACE) 80 MG tablet Take 1 tablet (80 mg total) by mouth 2 (two) times daily. 180 tablet 3   No current facility-administered medications for this visit.    Social history is notable in that she is married and has 3 children 4 grandchildren. He is no history of tobacco use. She has worked Education officer, environmental.  Family History  Problem Relation Age of Onset  . Heart failure Mother   . Heart failure Father   . Diabetes Father    ROS General: Negative; No fevers, chills, or night sweats;  HEENT: Negative; No changes in vision or hearing, sinus congestion, difficulty swallowing Pulmonary: Negative; No cough, wheezing, shortness of breath, hemoptysis Cardiovascular: Negative; No chest pain, presyncope, syncope, palpitations GI: Positive for GERD and diverticular disease; No nausea, vomiting, diarrhea, or abdominal pain GU: Negative; No dysuria, hematuria, or difficulty voiding Musculoskeletal: Positive for osteopenia and fractures of the vertebrae in her back Hematologic/Oncology: Negative; no easy bruising, bleeding Endocrine: Negative; no heat/cold intolerance; no diabetes Neuro: Negative; no changes in balance, headaches Skin: Negative; No rashes or skin lesions Psychiatric: Negative; No behavioral problems, depression Sleep: Negative; No snoring, daytime sleepiness, hypersomnolence, bruxism, restless legs, hypnogognic hallucinations, no cataplexy Other comprehensive 14 point system review is negative.   PE BP 110/76 mmHg  Pulse 49  Ht  5' 4" (1.626 m)  Wt 128 lb 3.2 oz (58.151 kg)  BMI 21.99 kg/m2  Blood pressure when taken by me was 122/80  Wt Readings from Last 3 Encounters:  10/28/15 128 lb 3.2 oz (58.151 kg)  04/25/15 136 lb 12.8 oz (62.052 kg)  12/24/14 140 lb 1.6 oz (63.549 kg)   General: Alert, oriented, no distress.  Skin: normal turgor, no rashes HEENT: Normocephalic, atraumatic. Pupils round and reactive; sclera anicteric; no nystagmus; extraocular muscles are full Fundi  no AV nicking or arteriolar narrowing. No hemorrhages or exudates.  Nose without nasal septal hypertrophy Mouth/Parynx benign; Mallinpatti scale 3 Neck: No JVD, no carotid bruits; normal carotid upstroke. No bisferens pulse Lungs: clear to ausculatation and percussion; no wheezing or rales Chest wall: without tenderness to palpitation Heart: RRR, s1 s2 normal 9-1/7 systolic murmur left sternal border; no diastolic murmur.  No rubs, thrills or heaves. Abdomen: soft, nontender; no hepatosplenomehaly, BS+; abdominal aorta nontender and not dilated by palpation. Back: no CVA tenderness Pulses 2+ Extremities: no clubbing cyanosis  or edema, Homan's sign negative  Neurologic: grossly nonfocal; Cranial nerves grossly wnl, Specifically, she had a fairly normal finger to nose test. She also had fairly normal rapid alternating movements. Psychologic: Normal mood and affect  ECG (independently read by me): Marked sinus bradycardia at 49 bpm.  QTc interval 437 ms.  04/25/2015 ECG (independently read by me):  Sinus bradycardia 53 bpm.  No ectopy.  QTc interval 431 ms.  May 2016ECG (independently read by me): Sinus bradycardia 56 bpm.  Normal intervals.  No ectopy.  December 2015 ECG (independently read by me): Sinus bradycardia 51 bpm with sinus arrhythmia and APC.  QTc interval 431 ms.  October 2015 ECG (independently read by me): Sinus bradycardia 55 beats per minute.  QTc interval 440 ms.  No ST segment changes.  December 2014 ECG: Normal sinus  rhythm at 50. PR interval 176 ms, QTc interval 430 ms. No significant ST-T changes.  LABS: BMP Latest Ref Rng 08/12/2015 03/27/2015 12/13/2014  Glucose 65 - 99 mg/dL 86 108(H) 108(H)  BUN 7 - 25 mg/dL 27(H) 25 17  Creatinine 0.60 - 0.93 mg/dL 0.69 0.62 0.65  Sodium 135 - 146 mmol/L 140 139 136  Potassium 3.5 - 5.3 mmol/L 4.9 4.8 4.4  Chloride 98 - 110 mmol/L 104 103 102  CO2 20 - 31 mmol/L _0 Calcium 8.6 - 10.4 mg/dL 10.1 9.3 9.6   Hepatic Function Latest Ref Rng 08/12/2015 03/27/2015 12/13/2014  Total Protein 6.1 - 8.1 g/dL 6.2 6.5 6.6  Albumin 3.6 - 5.1 g/dL 4.0 4.2 4.1  AST 10 - 35 U/L _1 ALT 6 - 29 U/L _2 Alk Phosphatase 33 - 130 U/L 39 64 66  Total Bilirubin 0.2 - 1.2 mg/dL 0.4 0.4 0.4   CBC Latest Ref Rng 12/13/2014 05/13/2014 06/26/2013  WBC 4.0 - 10.5 K/uL 5.4 5.3 6.6  Hemoglobin 12.0 - 15.0 g/dL 13.1 12.7 12.4  Hematocrit 36.0 - 46.0 % 39.5 37.9 37.4  Platelets 150 - 400 K/uL 274 250 284   Lab Results  Component Value Date   MCV 90.4 12/13/2014   MCV 91.1 05/13/2014   MCV 89.3 06/26/2013   Lab Results  Component Value Date   TSH 1.442 12/13/2014  No results found for: HGBA1C   Lipid Panel     Component Value Date/Time   CHOL 202* 08/12/2015 0814   TRIG 113 08/12/2015 0814   HDL 48 08/12/2015 0814   CHOLHDL 4.2 08/12/2015 0814   VLDL 23 08/12/2015 0814   LDLCALC 131* 08/12/2015 0814     RADIOLOGY: No results found.   ASSESSMENT AND PLAN: Ms. Gowin is a 75 year old female with history of sick sinus syndrome and paroxysmal atrial fibrillation. She has been maintaining normal sinus rhythm and is onsotalol 120 mg twice a day.  Her ECG today reveals marked sinus bradycardia at 49 bpm.  She has not had any recurrent PAF.  I have recommended that she reduce her sotalol dose to 80 mg twice a day in light of her underlying bradycardia.  She is on warfarin anticoagulation and denies recent bleeding.  She has experienced some intermittent episodes of  chest discomfort associated with some mild shortness of breath.  With this recurrent symptomatology, I have recommended a follow-up Lexiscan Myoview study to make certain there is no change from her prior evaluations.  Her GERD has improved with Protonix.   She had been taken off statin therapy by Dr. Dimas Millin and her lipids had  significantly increased. Recently, she had been on Zetia but could not afford this due to cost.  She is now on fenofibrate and over-the-counter fish oil. Her triglycerides have improved to 113 from being previously elevated at 311. Her LDL remains elevated at 131.  Time spent: 25 min  Troy Sine, MD, [facility] 10/29/2015 7:11 PM

## 2015-10-30 DIAGNOSIS — Z7901 Long term (current) use of anticoagulants: Secondary | ICD-10-CM | POA: Diagnosis not present

## 2015-11-10 DIAGNOSIS — Z7901 Long term (current) use of anticoagulants: Secondary | ICD-10-CM | POA: Diagnosis not present

## 2015-11-10 DIAGNOSIS — K219 Gastro-esophageal reflux disease without esophagitis: Secondary | ICD-10-CM | POA: Diagnosis not present

## 2015-11-10 DIAGNOSIS — I495 Sick sinus syndrome: Secondary | ICD-10-CM | POA: Diagnosis not present

## 2015-11-10 DIAGNOSIS — E782 Mixed hyperlipidemia: Secondary | ICD-10-CM | POA: Diagnosis not present

## 2015-11-10 DIAGNOSIS — Z0000 Encounter for general adult medical examination without abnormal findings: Secondary | ICD-10-CM | POA: Diagnosis not present

## 2015-11-10 DIAGNOSIS — I480 Paroxysmal atrial fibrillation: Secondary | ICD-10-CM | POA: Diagnosis not present

## 2015-11-12 ENCOUNTER — Telehealth (HOSPITAL_COMMUNITY): Payer: Self-pay

## 2015-11-12 NOTE — Telephone Encounter (Signed)
Encounter complete. 

## 2015-11-14 ENCOUNTER — Ambulatory Visit (HOSPITAL_COMMUNITY)
Admission: RE | Admit: 2015-11-14 | Discharge: 2015-11-14 | Disposition: A | Discharge status: Home / Self Care | Payer: Medicare Other | Source: Ambulatory Visit | Attending: Cardiovascular Disease | Admitting: Cardiovascular Disease

## 2015-11-14 DIAGNOSIS — R42 Dizziness and giddiness: Secondary | ICD-10-CM | POA: Insufficient documentation

## 2015-11-14 DIAGNOSIS — R0789 Other chest pain: Secondary | ICD-10-CM

## 2015-11-14 DIAGNOSIS — R0602 Shortness of breath: Secondary | ICD-10-CM | POA: Insufficient documentation

## 2015-11-14 LAB — MYOCARDIAL PERFUSION IMAGING
CHL CUP NUCLEAR SSS: 3
CSEPPHR: 71 {beats}/min
LV dias vol: 60 mL (ref 46–106)
LVSYSVOL: 18 mL
NUC STRESS TID: 1.04
Rest HR: 50 {beats}/min
SDS: 3
SRS: 0

## 2015-11-14 MED ORDER — REGADENOSON 0.4 MG/5ML IV SOLN
0.4000 mg | Freq: Once | INTRAVENOUS | Status: AC
  Administered 2015-11-14: 0.4 mg via INTRAVENOUS

## 2015-11-14 MED ORDER — TECHNETIUM TC 99M SESTAMIBI GENERIC - CARDIOLITE
10.3000 | Freq: Once | INTRAVENOUS | Status: AC | PRN
Start: 2015-11-14 — End: 2015-11-14
  Administered 2015-11-14: 10.3 via INTRAVENOUS

## 2015-11-14 MED ORDER — AMINOPHYLLINE 25 MG/ML IV SOLN
75.0000 mg | Freq: Once | INTRAVENOUS | Status: AC
  Administered 2015-11-14: 75 mg via INTRAVENOUS

## 2015-11-14 MED ORDER — TECHNETIUM TC 99M SESTAMIBI GENERIC - CARDIOLITE
30.1000 | Freq: Once | INTRAVENOUS | Status: AC | PRN
  Administered 2015-11-14: 30.1 via INTRAVENOUS

## 2015-11-24 ENCOUNTER — Encounter: Payer: Self-pay | Admitting: *Deleted

## 2015-12-09 DIAGNOSIS — Z7901 Long term (current) use of anticoagulants: Secondary | ICD-10-CM | POA: Diagnosis not present

## 2015-12-23 ENCOUNTER — Telehealth: Payer: Self-pay | Admitting: Cardiovascular Disease

## 2015-12-23 NOTE — Telephone Encounter (Signed)
Spoke with pt, dr Tresa Endokelly had changed her sotalol dose at last ov. She has had 3 episodes of atrial fib since last ov. Otherwise, she has no complaints. There are no available appointments, will forward to dr Tresa Endokelly for recommendations.

## 2015-12-23 NOTE — Telephone Encounter (Signed)
Pt needs July appt and Dr. Tresa EndoKelly booked until September- needs 10a or 11a-pls call

## 2015-12-31 DIAGNOSIS — J40 Bronchitis, not specified as acute or chronic: Secondary | ICD-10-CM | POA: Diagnosis not present

## 2015-12-31 DIAGNOSIS — J302 Other seasonal allergic rhinitis: Secondary | ICD-10-CM | POA: Diagnosis not present

## 2016-01-04 NOTE — Telephone Encounter (Signed)
Ok to add on to schedule in July

## 2016-01-05 NOTE — Telephone Encounter (Signed)
New Message,   Pt stated she received a call starting she was to schedule her appt in July. I let pt know my next avail app in September. She did not want that appt and did not want to sch w. PA. Please call back to discuss.

## 2016-01-05 NOTE — Telephone Encounter (Signed)
Left message for pt to call to schedule

## 2016-01-05 NOTE — Telephone Encounter (Signed)
Routed to [facility] scheduler to schedule appt with Dr Tresa EndoKelly.

## 2016-01-12 ENCOUNTER — Telehealth: Payer: Self-pay | Admitting: Cardiovascular Disease

## 2016-01-12 NOTE — Telephone Encounter (Signed)
New Message  Pt called c/o of afib. When she is trying to fee the birds or walk the groceries in she feels as if she might pass out. Pt states that as long as she doesn't exert herself she is fine. Pt is not sure if its the heat or what. please call back to discuss

## 2016-01-12 NOTE — Telephone Encounter (Signed)
Patient has been having episodes of increased Afib since Sotalol decreased to 80 mg twice a day She thinks they may be happening more often since has gotten hotter Patient is unsure how often she is having.  Did ask if daily or multiple times a day, unsure just kept saying when she exerts herself She does not check her blood pressure or heart rate at home She feels like her heart is racing and can not breath but knows she can.  Episodes last for 1 minute or less.  States she feels fine once her episodes pass Will forward to Dr Tresa EndoKelly for review

## 2016-01-13 NOTE — Telephone Encounter (Signed)
Her sotalol dose had been 120 mg twice a day, but when last seen in the office.  She was significantly bradycardic 2, heart rate of 49.  She can try to resume sotalol 120 mg in the morning but continue 80 mg at night.  She should have a follow-up office visit in ECG following dose adjustment.

## 2016-01-13 NOTE — Telephone Encounter (Signed)
Advised patient, verbalized understanding Scheduled f/u with R Barrett PA

## 2016-01-15 ENCOUNTER — Other Ambulatory Visit: Payer: Self-pay | Admitting: Cardiovascular Disease

## 2016-01-15 NOTE — Telephone Encounter (Signed)
Rx(s) sent to pharmacy electronically.  

## 2016-01-19 DIAGNOSIS — S1096XA Insect bite of unspecified part of neck, initial encounter: Secondary | ICD-10-CM | POA: Diagnosis not present

## 2016-01-19 DIAGNOSIS — W57XXXA Bitten or stung by nonvenomous insect and other nonvenomous arthropods, initial encounter: Secondary | ICD-10-CM | POA: Diagnosis not present

## 2016-01-19 DIAGNOSIS — L089 Local infection of the skin and subcutaneous tissue, unspecified: Secondary | ICD-10-CM | POA: Diagnosis not present

## 2016-01-29 ENCOUNTER — Ambulatory Visit: Payer: Medicare Other | Admitting: Physician Assistant

## 2016-01-30 ENCOUNTER — Telehealth: Payer: Self-pay | Admitting: *Deleted

## 2016-01-30 DIAGNOSIS — Z7901 Long term (current) use of anticoagulants: Secondary | ICD-10-CM | POA: Diagnosis not present

## 2016-01-30 DIAGNOSIS — T887XXD Unspecified adverse effect of drug or medicament, subsequent encounter: Secondary | ICD-10-CM | POA: Diagnosis not present

## 2016-01-30 DIAGNOSIS — R42 Dizziness and giddiness: Secondary | ICD-10-CM | POA: Diagnosis not present

## 2016-01-30 DIAGNOSIS — I480 Paroxysmal atrial fibrillation: Secondary | ICD-10-CM | POA: Diagnosis not present

## 2016-01-30 NOTE — Telephone Encounter (Signed)
Pt is at PCP office, she is having general weakness and unsteadiness all the time. Her bp is 110 and her pulse is 60. No change was recommended at this time by dr Jens Somcrenshaw

## 2016-02-04 ENCOUNTER — Emergency Department (HOSPITAL_COMMUNITY)
Admission: EM | Admit: 2016-02-04 | Discharge: 2016-02-04 | Disposition: A | Discharge status: Home / Self Care | Payer: Medicare Other | Attending: Emergency Medicine | Admitting: Emergency Medicine

## 2016-02-04 ENCOUNTER — Emergency Department (HOSPITAL_COMMUNITY): Payer: Medicare Other

## 2016-02-04 ENCOUNTER — Encounter (HOSPITAL_COMMUNITY): Payer: Self-pay | Admitting: Emergency Medicine

## 2016-02-04 DIAGNOSIS — I2510 Atherosclerotic heart disease of native coronary artery without angina pectoris: Secondary | ICD-10-CM | POA: Insufficient documentation

## 2016-02-04 DIAGNOSIS — R531 Weakness: Secondary | ICD-10-CM | POA: Diagnosis not present

## 2016-02-04 DIAGNOSIS — T450X5A Adverse effect of antiallergic and antiemetic drugs, initial encounter: Secondary | ICD-10-CM | POA: Insufficient documentation

## 2016-02-04 DIAGNOSIS — Y829 Unspecified medical devices associated with adverse incidents: Secondary | ICD-10-CM | POA: Diagnosis not present

## 2016-02-04 DIAGNOSIS — R404 Transient alteration of awareness: Secondary | ICD-10-CM | POA: Diagnosis not present

## 2016-02-04 DIAGNOSIS — T887XXA Unspecified adverse effect of drug or medicament, initial encounter: Secondary | ICD-10-CM | POA: Diagnosis not present

## 2016-02-04 DIAGNOSIS — J45909 Unspecified asthma, uncomplicated: Secondary | ICD-10-CM | POA: Diagnosis not present

## 2016-02-04 DIAGNOSIS — R0602 Shortness of breath: Secondary | ICD-10-CM | POA: Diagnosis not present

## 2016-02-04 DIAGNOSIS — R42 Dizziness and giddiness: Secondary | ICD-10-CM

## 2016-02-04 DIAGNOSIS — Z7901 Long term (current) use of anticoagulants: Secondary | ICD-10-CM | POA: Diagnosis not present

## 2016-02-04 DIAGNOSIS — T50905A Adverse effect of unspecified drugs, medicaments and biological substances, initial encounter: Secondary | ICD-10-CM

## 2016-02-04 LAB — CBC WITH DIFFERENTIAL/PLATELET
BASOS PCT: 0 %
Basophils Absolute: 0 10*3/uL (ref 0.0–0.1)
EOS ABS: 0.1 10*3/uL (ref 0.0–0.7)
Eosinophils Relative: 2 %
HCT: 35.5 % — ABNORMAL LOW (ref 36.0–46.0)
HEMOGLOBIN: 11.5 g/dL — AB (ref 12.0–15.0)
LYMPHS ABS: 1.9 10*3/uL (ref 0.7–4.0)
Lymphocytes Relative: 37 %
MCH: 30.2 pg (ref 26.0–34.0)
MCHC: 32.4 g/dL (ref 30.0–36.0)
MCV: 93.2 fL (ref 78.0–100.0)
MONO ABS: 0.7 10*3/uL (ref 0.1–1.0)
MONOS PCT: 13 %
NEUTROS PCT: 48 %
Neutro Abs: 2.4 10*3/uL (ref 1.7–7.7)
Platelets: 267 10*3/uL (ref 150–400)
RBC: 3.81 MIL/uL — ABNORMAL LOW (ref 3.87–5.11)
RDW: 13 % (ref 11.5–15.5)
WBC: 5 10*3/uL (ref 4.0–10.5)

## 2016-02-04 LAB — URINALYSIS, ROUTINE W REFLEX MICROSCOPIC
Bilirubin Urine: NEGATIVE
GLUCOSE, UA: NEGATIVE mg/dL
HGB URINE DIPSTICK: NEGATIVE
Ketones, ur: NEGATIVE mg/dL
Nitrite: NEGATIVE
PH: 7 (ref 5.0–8.0)
Protein, ur: NEGATIVE mg/dL
SPECIFIC GRAVITY, URINE: 1.017 (ref 1.005–1.030)

## 2016-02-04 LAB — URINE MICROSCOPIC-ADD ON
RBC / HPF: NONE SEEN RBC/hpf (ref 0–5)
Squamous Epithelial / LPF: NONE SEEN
WBC, UA: NONE SEEN WBC/hpf (ref 0–5)

## 2016-02-04 LAB — COMPREHENSIVE METABOLIC PANEL
ALBUMIN: 3.7 g/dL (ref 3.5–5.0)
ALK PHOS: 38 U/L (ref 38–126)
ALT: 21 U/L (ref 14–54)
AST: 22 U/L (ref 15–41)
Anion gap: 6 (ref 5–15)
BUN: 28 mg/dL — AB (ref 6–20)
CALCIUM: 9.7 mg/dL (ref 8.9–10.3)
CHLORIDE: 107 mmol/L (ref 101–111)
CO2: 26 mmol/L (ref 22–32)
CREATININE: 0.8 mg/dL (ref 0.44–1.00)
GFR calc non Af Amer: >60 mL/min (ref >60)
GLUCOSE: 95 mg/dL (ref 65–99)
Potassium: 3.9 mmol/L (ref 3.5–5.1)
SODIUM: 139 mmol/L (ref 135–145)
Total Bilirubin: 0.3 mg/dL (ref 0.3–1.2)
Total Protein: 6.3 g/dL — ABNORMAL LOW (ref 6.5–8.1)

## 2016-02-04 LAB — PROTIME-INR
INR: 2.25 — ABNORMAL HIGH (ref 0.00–1.49)
PROTHROMBIN TIME: 24.7 s — AB (ref 11.6–15.2)

## 2016-02-04 LAB — I-STAT TROPONIN, ED: Troponin i, poc: 0 ng/mL (ref 0.00–0.08)

## 2016-02-04 NOTE — Discharge Instructions (Signed)

## 2016-02-04 NOTE — ED Notes (Signed)
Pt st's she has been having dizzy spells x'6 days.  St's she feels off balance when she walks.  Pt st's the dizzy spells come and go but is worse when she stands after sitting or if she is in the heat.  Pt denies any nausea with the dizziness.  Pt alert and oriented  X's 3.  Skin warm and dry, color appropriate

## 2016-02-04 NOTE — ED Provider Notes (Signed)
CSN: [id number]     Arrival date & time 02/04/16  1553 History   First MD Initiated Contact with Patient 02/04/16 1608     Chief Complaint  Patient presents with  . Dizziness    Julie Kemp is a 75 y.o. female who presents to the ED complaining of feeling lightheaded with position change and difficulty focusing her eyes for the past 6 days. She reports feeling lightheaded with position change. She reports trouble with her balance for the past 2-3 years and has seen neurology previously for this. She complains of a left sided frontal headache. While at primary care Friday having INR checked patient states her heart rate was fluctuating from 30 to 60 bpm.  Admits to several changes in her Sotalol in the last 5 months.  Per cardiology switched from 120mg  BID to 80mg  BID because of bradycardia, One month ago it was changed to 120mg  in AM and 80mg  in PM because of increased Afib episodes.  History of taking Neuroplex supplement x 2 years, discontinued 1 week ago because she ran out. Took 3 Dramamine today with no relief of symptoms. She had a normal Myoview in April of this year.  Denies chest pain, fever, nausea, vomiting, diarrhea, eye pain, double vision, abdominal pain, jaw pain, and cough different from baseline.  Patient is a 75 y.o. female presenting with dizziness. The history is provided by the patient. No language interpreter was used.  Dizziness Associated symptoms: headaches   Associated symptoms: no chest pain, no diarrhea, no nausea, no palpitations, no shortness of breath, no vomiting and no weakness     Past Medical History  Diagnosis Date  . Atrial fibrillation (HCC)     on coumadin/  . Unsteady gait   . Asthma   . Coronary artery disease 03/08/2013    The cavity size was normal . Wall thickness was increased in a pattern of mild LVH . Systolic function was normal . The estimated ejection fraction of 55%to60% . Wall motion was normal . There were no regional  wall motion  abnormalities.   Marland Kitchen. Heart murmur     office visit 03/01/2013  . Heart murmur 10/11/2001    Normal myocardial perfushion study.No significant ischemia demonstrated. This is a low risk scan  . CAD (coronary artery disease) 12/21/12    This is a normal carotid duplex Doppler evaluation   Past Surgical History  Procedure Laterality Date  . Abdominal hysterectomy    . Rectocele repair    . Lymph nodes      left groin lymph node biopsies for benign reasons  . Left femoral hernia repair     Family History  Problem Relation Age of Onset  . Heart failure Mother   . Heart failure Father   . Diabetes Father    Social History  Substance Use Topics  . Smoking status: Never Smoker   . Smokeless tobacco: Never Used  . Alcohol Use: No   OB History    No data available     Review of Systems  Constitutional: Negative for fever and chills.  HENT: Negative for congestion, ear discharge, ear pain and sore throat.   Eyes: Positive for visual disturbance. Negative for photophobia, pain and discharge.  Respiratory: Negative for cough, shortness of breath and wheezing.   Cardiovascular: Negative for chest pain and palpitations.  Gastrointestinal: Negative for nausea, vomiting, abdominal pain and diarrhea.  Genitourinary: Negative for dysuria.  Musculoskeletal: Negative for back pain and neck pain.  Skin:  Negative for rash.  Neurological: Positive for light-headedness and headaches. Negative for dizziness, seizures, syncope, speech difficulty, weakness and numbness.      Allergies  Zithromax; Amoxicillin; and Sulfa antibiotics  Home Medications   Prior to Admission medications   Medication Sig Start Date End Date Taking? Authorizing Provider  Calcium Carbonate-Vit D-Min (CALCIUM 1200 PO) Take 1 tablet by mouth 2 (two) times daily.   Yes Historical Provider, MD  chlorhexidine (PERIDEX) 0.12 % solution Use as directed 5 mLs in the mouth or throat daily. Rinse as directed 08/30/13  Yes  Historical Provider, MD  dimenhyDRINATE (DRAMAMINE) 50 MG tablet Take 150-300 mg by mouth daily.   Yes Historical Provider, MD  fenofibrate 160 MG tablet Take 1 tablet by mouth  daily Patient taking differently: Take 1 tablet by mouth at night 01/15/16  Yes Lennette Bihari, MD  FERREX 150 150 MG capsule TAKE 1 CAPSULE BY MOUTH ONCE DAILY 10/08/15  Yes Lennette Bihari, MD  Multiple Vitamin (MULTIVITAMIN WITH MINERALS) TABS tablet Take 1 tablet by mouth daily.   Yes Historical Provider, MD  Omega-3 Fatty Acids (FISH OIL PO) Take 2 capsules by mouth 2 (two) times daily. Taking Nature Bounty 1200 mg   Yes Historical Provider, MD  polyethylene glycol (MIRALAX / GLYCOLAX) packet Take 17 g by mouth daily.   Yes Historical Provider, MD  Probiotic Product (PROBIOTIC DAILY) CAPS Take 1 capsule by mouth every evening.   Yes Historical Provider, MD  sotalol (BETAPACE) 120 MG tablet Take 120 mg by mouth every morning.   Yes Historical Provider, MD  sotalol (BETAPACE) 80 MG tablet Take 80 mg by mouth every evening.   Yes Historical Provider, MD  temazepam (RESTORIL) 15 MG capsule Take 1 capsule (15 mg total) by mouth at bedtime as needed for sleep. 04/19/14  Yes Lennette Bihari, MD  warfarin (COUMADIN) 5 MG tablet Take 1 tablet by mouth daily or as directed by coumadin clinic Patient taking differently: Take 2.5 mg by mouth daily at 6 PM. Take 1 tablet by mouth daily or as directed by coumadin clinic 07/30/14  Yes Lennette Bihari, MD  pantoprazole (PROTONIX) 40 MG tablet Take 1 tablet by mouth  daily 01/15/16   Lennette Bihari, MD   BP 117/67 mmHg  Pulse 55  Temp(Src) 97.9 F (36.6 C) (Oral)  Resp 17  Ht 5\' 3"  (1.6 m)  Wt 58.06 kg  BMI 22.68 kg/m2  SpO2 98% Physical Exam  Constitutional: She is oriented to person, place, and time. She appears well-developed and well-nourished. No distress.  Nontoxic appearing.  HENT:  Head: Normocephalic and atraumatic.  Right Ear: External ear normal.  Left Ear: External ear  normal.  Mouth/Throat: Oropharynx is clear and moist. No oropharyngeal exudate.  Eyes: Conjunctivae and EOM are normal. Pupils are equal, round, and reactive to light. Right eye exhibits no discharge. Left eye exhibits no discharge.  EOMs are intact. Vision is grossly intact.  Neck: Normal range of motion. Neck supple. No JVD present. No tracheal deviation present.  Cardiovascular: Normal rate, regular rhythm, normal heart sounds and intact distal pulses.  Exam reveals no gallop and no friction rub.   No murmur heard. Pulmonary/Chest: Effort normal and breath sounds normal. No stridor. No respiratory distress. She has no wheezes. She has no rales.  Lungs clear to auscultation bilaterally.  Abdominal: Soft. Bowel sounds are normal. There is no tenderness. There is no guarding.  Abdomen is soft and nontender.  Musculoskeletal: She exhibits  no edema or tenderness.  No lower extremity edema or tenderness. Patient has 5 out of 5 strength in her bilateral upper and lower extremities.  Lymphadenopathy:    She has no cervical adenopathy.  Neurological: She is alert and oriented to person, place, and time. No cranial nerve deficit. Coordination normal.  Patient is alert and oriented 3. Cranial nerves are intact. Speech is clear and coherent. No pronator drift. Speech is clear and coherent. Sensation is intact in her bilateral upper and lower extremities. Finger to nose intact bilaterally. EOMs are intact. Vision is grossly intact.  Skin: Skin is warm and dry. No rash noted. She is not diaphoretic. No erythema. No pallor.  Psychiatric: She has a normal mood and affect. Her behavior is normal.  Nursing note and vitals reviewed.   ED Course  Procedures (including critical care time) Labs Review Labs Reviewed  COMPREHENSIVE METABOLIC PANEL - Abnormal; Notable for the following:    BUN 28 (*)    Total Protein 6.3 (*)    All other components within normal limits  CBC WITH DIFFERENTIAL/PLATELET -  Abnormal; Notable for the following:    RBC 3.81 (*)    Hemoglobin 11.5 (*)    HCT 35.5 (*)    All other components within normal limits  PROTIME-INR - Abnormal; Notable for the following:    Prothrombin Time 24.7 (*)    INR 2.25 (*)    All other components within normal limits  URINALYSIS, ROUTINE W REFLEX MICROSCOPIC (NOT AT [facility]) - Abnormal; Notable for the following:    APPearance CLOUDY (*)    Leukocytes, UA SMALL (*)    All other components within normal limits  URINE MICROSCOPIC-ADD ON - Abnormal; Notable for the following:    Bacteria, UA FEW (*)    All other components within normal limits  I-STAT TROPOININ, ED    Imaging Review Dg Chest 2 View  02/04/2016  CLINICAL DATA:  Eyes aren't focusing/glazing over and feels like she could pass out/SOB x Friday, HA x today, prediabetic, hx AFIB EXAM: CHEST  2 VIEW COMPARISON:  08/21/2014 FINDINGS: There is hazy left lower lobe airspace disease which may reflect atelectasis versus pneumonia. There is no pleural effusion or pneumothorax. The heart and mediastinal contours are unremarkable. There is a chronic T11 vertebral body compression fracture. IMPRESSION: There is hazy left lower lobe airspace disease which may reflect atelectasis versus pneumonia. Electronically Signed   By: Elige Ko   On: 02/04/2016 17:43   Ct Head Wo Contrast  02/04/2016  CLINICAL DATA:  Dizzy spells for 6 days, spells come and go and are worse when standing after sitting or in the heat, history atrial fibrillation, coronary artery disease EXAM: CT HEAD WITHOUT CONTRAST TECHNIQUE: Contiguous axial images were obtained from the base of the skull through the vertex without intravenous contrast. COMPARISON:  08/21/2014 FINDINGS: Normal ventricular morphology. No midline shift or mass effect. Normal appearance of brain parenchyma. No intracranial hemorrhage, mass lesion, or evidence acute infarction. No extra-axial fluid collections. Sinuses clear and bones  unremarkable. Atherosclerotic calcifications at the carotid siphons. IMPRESSION: No acute intracranial abnormalities. Electronically Signed   By: Ulyses Southward M.D.   On: 02/04/2016 17:34   I have personally reviewed and evaluated these images and lab results as part of my medical decision-making.   EKG Interpretation   Date/Time:  Wednesday February 04 2016 16:00:51 EDT Ventricular Rate:  71 PR Interval:    QRS Duration: 91 QT Interval:  403 QTC Calculation: 438  R Axis:   9 Text Interpretation:  Sinus rhythm Abnormal R-wave progression, early  transition Borderline T wave abnormalities No previous tracing Confirmed  by KNOTT MD, DANIEL [phone]) on 02/04/2016 9:03:40 PM      Filed Vitals:   02/04/16 2013 02/04/16 2014 02/04/16 2030 02/04/16 2209  BP: 147/75  129/84 117/67  Pulse: 45 48 55 55  Temp:    97.9 F (36.6 C)  TempSrc:    Oral  Resp:  Height:      Weight:      SpO2:  98% 100% 98%     MDM   Meds given in ED:  Medications - No data to display  Discharge Medication List as of 02/04/2016  9:35 PM      Final diagnoses:  Lightheadedness  Medication side effect, initial encounter   This is a 75 y.o. female who presents to the ED complaining of feeling lightheaded with position change and difficulty focusing her eyes for the past 6 days. She reports feeling lightheaded with position change. She reports trouble with her balance for the past 2-3 years and has seen neurology previously for this. She complains of a left sided frontal headache. While at primary care Friday having INR checked patient states her heart rate was fluctuating from 30 to 60 bpm.  Admits to several changes in her Sotalol in the last 5 months.  Per cardiology switched from  BID to  BID because of bradycardia, One month ago it was changed to  in AM and  in PM because of increased Afib episodes.  On exam the patient is afebrile nontoxic appearing. She has no focal neurological  deficits. Lungs clear to auscultation bilaterally. Urinalysis is nitrite negative with no white blood cells. Troponin is 0. CMP is unremarkable. CBC shows no leukocytosis and hemoglobin that is stable at 11.5. INR is therapeutic at 2.25. CT head shows no acute findings. Chest x-ray shows hazy left lower lobe airspace disease which may reflect atelectasis versus pneumonia. EKG shows no changes from her last tracing. She is not orthostatic. Patient has not been having fevers. She reports no worsening cough. She later tells me she has completed a course of doxycycline today after she spotted a tick on her. She has no rashes to her body. The patient essentially has been treated for community-acquired pneumonia. This chest x-ray may be revealing of improving pneumonia as the patient is feeling better. At reevaluation she reports her feeling of visual disturbance has resolved. She is ambulated without difficulty in the emergency department. She reports she is feeling better. I question if these symptoms over the past 6 days are related to her taking doxycycline or her sotalol. Will have her follow up closely with primary care for repeat chest x-ray and follow-up with cardiology for medication management. I advised the patient to follow-up with their primary care provider this week. I advised the patient to return to the emergency department with new or worsening symptoms or new concerns. The patient and her family verbalized understanding and agreement with plan.    This patient was discussed with and evaluated by Dr. Clydene Pugh who agrees with assessment and plan.     Everlene Farrier, PA-C 02/05/16 6045  Lyndal Pulley, MD 02/05/16 [phone]

## 2016-02-11 DIAGNOSIS — R0689 Other abnormalities of breathing: Secondary | ICD-10-CM | POA: Diagnosis not present

## 2016-02-11 DIAGNOSIS — J181 Lobar pneumonia, unspecified organism: Secondary | ICD-10-CM | POA: Diagnosis not present

## 2016-02-11 DIAGNOSIS — R0600 Dyspnea, unspecified: Secondary | ICD-10-CM | POA: Diagnosis not present

## 2016-02-20 DIAGNOSIS — R42 Dizziness and giddiness: Secondary | ICD-10-CM | POA: Diagnosis not present

## 2016-03-01 ENCOUNTER — Ambulatory Visit (INDEPENDENT_AMBULATORY_CARE_PROVIDER_SITE_OTHER): Payer: Medicare Other | Admitting: Cardiovascular Disease

## 2016-03-01 ENCOUNTER — Encounter (INDEPENDENT_AMBULATORY_CARE_PROVIDER_SITE_OTHER): Payer: Self-pay

## 2016-03-01 VITALS — BP 98/62 | HR 49 | Ht 63.0 in | Wt 128.0 lb

## 2016-03-01 DIAGNOSIS — E781 Pure hyperglyceridemia: Secondary | ICD-10-CM

## 2016-03-01 DIAGNOSIS — I4891 Unspecified atrial fibrillation: Secondary | ICD-10-CM

## 2016-03-01 DIAGNOSIS — I480 Paroxysmal atrial fibrillation: Secondary | ICD-10-CM | POA: Diagnosis not present

## 2016-03-01 DIAGNOSIS — Z7901 Long term (current) use of anticoagulants: Secondary | ICD-10-CM | POA: Diagnosis not present

## 2016-03-01 DIAGNOSIS — K219 Gastro-esophageal reflux disease without esophagitis: Secondary | ICD-10-CM

## 2016-03-01 DIAGNOSIS — R42 Dizziness and giddiness: Secondary | ICD-10-CM

## 2016-03-01 MED ORDER — EZETIMIBE 10 MG PO TABS: 10.0000 mg | ORAL_TABLET | Freq: Every day | ORAL | 3 refills | Status: DC

## 2016-03-01 NOTE — Patient Instructions (Signed)
Your physician recommends that you return for lab work in 3 months fasting.  Start new generic zetia prescription. [facility].  Your physician wants you to follow-up in: 6 months or sooner if needed.You will receive a reminder letter in the mail two months in advance. If you don't receive a letter, please call our office to schedule the follow-up appointment.

## 2016-03-03 ENCOUNTER — Other Ambulatory Visit: Payer: Self-pay | Admitting: *Deleted

## 2016-03-03 ENCOUNTER — Encounter: Payer: Self-pay | Admitting: Cardiovascular Disease

## 2016-03-03 NOTE — Progress Notes (Signed)
Patient ID: Julie Kemp, female   DOB: October 17, 1940, 75 y.o.   MRN: [id number]    Primary M.D.: Dr. Janace Litten  HPI: Julie Kemp  is a 75 year old is a former patient of Julie Kemp who established care with me after his retirement.  She presents for a 4 month follow-up evaluation  Julie Kemp has a history of sick sinus syndrome with PAF and has been on chronic sotalol therapy. She also has a history of anemia and diverticular disease. She has been on chronic Coumadin anticoagulation therapy. An echo Doppler study in August 2014 showed mild left ventricular hypertrophy with ejection fraction of 55-60% without regional wall motion abnormalities. She did have mitral annular calcification with moderate mitral regurgitation directed centrally. She had mild-to-moderate LA dilatation and mild RA dilatation. In 2010 a nuclear perfusion study showed fairly normal perfusion. In May 2014 she underwent carotid studies which were essentially normal.  When I initially saw her in October 2015 she was noticing more  palpitations that would often last up to 30 seconds. At that time, I recommended that she titrate her sotalol from 120 mg in the morning and 80 mg at night to 120 mg twice a day. Laboratory demonstrated a normal magnesium level at 2.3. She had normal electrolytes and previously had normal thyroid function. Her hemoglobin was 12.4 hematocrit 37.4 She also has noted some shortness of breath.    She has a history of GERD and has been on chronic omeprazole 20 mg.  Recently she's noticed increasing heartburn symptoms.  There is mild shortness of breath with activity.  Her last stress test was 5-1/2 years ago.  She has seen JulieYan for imbalance issues and ultimately was referred to Dr. Dimas Millin in [place]. She was taken off her statin therapy by Dr. Dimas Millin due to concerns of hip osteopenia.  However, she is still on proton pump inhibition, which has not been altered and may affect calcium was  stasis.  A nuclear perfusion study in October 2015  was low risk and showed a small, mild intensity fixed anterior defect consistent with soft tissue attenuation without scar or ischemia.  She had normal wall motion and LV function.  Ejection fraction was 68%.    Last year, when she was on atorvastatin her total cholesterol was 177 and when I last saw her when she was off statin therapy cholesterol had increased to 274.  Her triglycerides have increased from 189 to  311.  LDL has increased from 96 to177.  She is on sotalol 120 mg twice a day.  She also is on iron supplementation. She had been back on Zetia but again, stopped taking Zetia. She is on fenofibrate and the counter fish oil capsules. We had given her samples of Vascepa, but she did not fill the prescription due to cost she continues to be on anticoagulation.   When I last saw her, she complained of intermittent chest discomfort associated with mild shortness of breath.  She deniesd any palpitations.  She underwent a nuclear perfusion study on April 20 117 which was normal.  Ejection fraction was greater than 65%.  Approximate 2 weeks ago, she experienced episodes of dizziness.  She was unaware of any recurrent atrial fibrillation.  She has been maintained on sotalol 80 mg twice a day in addition to Coumadin.  She has been on fenofibrate 160 mg and fish oil.  She also is on pantoprazole for GERD.  She presents for reevaluation.   Past Surgical History:  Procedure  Laterality Date  . ABDOMINAL HYSTERECTOMY    . Left femoral hernia repair    . lymph nodes     left groin lymph node biopsies for benign reasons  . RECTOCELE REPAIR      Allergies  Allergen Reactions  . Zithromax [Azithromycin] Nausea And Vomiting  . Amoxicillin Other (See Comments)    Can take Ceftin-per note  . Sulfa Antibiotics Nausea Only    Current Outpatient Prescriptions  Medication Sig Dispense Refill  . Calcium Carbonate-Vit D-Min (CALCIUM 1200 PO) Take 1  tablet by mouth 2 (two) times daily.    . chlorhexidine (PERIDEX) 0.12 % solution Use as directed 5 mLs in the mouth or throat daily. Rinse as directed    . dimenhyDRINATE (DRAMAMINE) 50 MG tablet Take 150-300 mg by mouth daily.    . fenofibrate 160 MG tablet Take 1 tablet by mouth  daily (Patient taking differently: Take 1 tablet by mouth at night) 90 tablet 3  . FERREX 150 150 MG capsule TAKE 1 CAPSULE BY MOUTH ONCE DAILY 100 capsule 5  . Multiple Vitamin (MULTIVITAMIN WITH MINERALS) TABS tablet Take 1 tablet by mouth daily.    . Omega-3 Fatty Acids (FISH OIL PO) Take 2 capsules by mouth 2 (two) times daily. Taking Nature Bounty 1200 mg    . pantoprazole (PROTONIX) 40 MG tablet Take 1 tablet by mouth  daily 90 tablet 3  . polyethylene glycol (MIRALAX / GLYCOLAX) packet Take 17 g by mouth daily.    . Probiotic Product (PROBIOTIC DAILY) CAPS Take 1 capsule by mouth every evening.    . sotalol (BETAPACE) 120 MG tablet Take 120 mg by mouth every morning.    . sotalol (BETAPACE) 80 MG tablet Take 80 mg by mouth every evening.    . temazepam (RESTORIL) 15 MG capsule Take 1 capsule (15 mg total) by mouth at bedtime as needed for sleep. 10 capsule 0  . warfarin (COUMADIN) 5 MG tablet Take 1 tablet by mouth daily or as directed by coumadin clinic (Patient taking differently: Take 2.5 mg by mouth daily at 6 PM. Take 1 tablet by mouth daily or as directed by coumadin clinic) 90 tablet 1  . ezetimibe (ZETIA) 10 MG tablet Take 1 tablet (10 mg total) by mouth daily. 90 tablet 3   No current facility-administered medications for this visit.     Social history is notable in that she is married and has 3 children 4 grandchildren. He is no history of tobacco use. She has worked Education officer, environmental.  Family History  Problem Relation Age of Onset  . Heart failure Mother   . Heart failure Father   . Diabetes Father    ROS General: Negative; No fevers, chills, or night sweats;  HEENT: Negative; No  changes in vision or hearing, sinus congestion, difficulty swallowing Pulmonary: Negative; No cough, wheezing, shortness of breath, hemoptysis Cardiovascular: Negative; No chest pain, presyncope, syncope, palpitations GI: Positive for GERD and diverticular disease; No nausea, vomiting, diarrhea, or abdominal pain GU: Negative; No dysuria, hematuria, or difficulty voiding Musculoskeletal: Positive for osteopenia and fractures of the vertebrae in her back Hematologic/Oncology: Negative; no easy bruising, bleeding Endocrine: Negative; no heat/cold intolerance; no diabetes Neuro: Negative; no changes in balance, headaches Skin: Negative; No rashes or skin lesions Psychiatric: Negative; No behavioral problems, depression Sleep: Negative; No snoring, daytime sleepiness, hypersomnolence, bruxism, restless legs, hypnogognic hallucinations, no cataplexy Other comprehensive 14 point system review is negative.   PE BP 98/62   Pulse Marland Kitchen)  49   Ht _0  (1.6 m)   Wt 128 lb (58.1 kg)   BMI 22.67 kg/m   Blood pressure when taken by me was 130/80 supine and 124/80 standing  Wt Readings from Last 3 Encounters:  03/01/16 128 lb (58.1 kg)  02/04/16 128 lb (58.1 kg)  11/14/15 128 lb (58.1 kg)   General: Alert, oriented, no distress.  Skin: normal turgor, no rashes HEENT: Normocephalic, atraumatic. Pupils round and reactive; sclera anicteric; no nystagmus; extraocular muscles are full Fundi  no AV nicking or arteriolar narrowing. No hemorrhages or exudates.  Nose without nasal septal hypertrophy Mouth/Parynx benign; Mallinpatti scale 3 Neck: No JVD, no carotid bruits; normal carotid upstroke. No bisferens pulse Lungs: clear to ausculatation and percussion; no wheezing or rales Chest wall: without tenderness to palpitation Heart: RRR, s1 s2 normal 6-3/8 systolic murmur left sternal border; no diastolic murmur.  No rubs, thrills or heaves. Abdomen: soft, nontender; no hepatosplenomehaly, BS+; abdominal  aorta nontender and not dilated by palpation. Back: no CVA tenderness Pulses 2+ Extremities: no clubbing cyanosis or edema, Homan's sign negative  Neurologic: grossly nonfocal; Cranial nerves grossly wnl, Specifically, she had a fairly normal finger to nose test. She also had fairly normal rapid alternating movements. Psychologic: Normal mood and affect  ECG (independently read by me): Sinus bradycardia at 49 bpm.  QTc interval 426 ms.  ECG (independently read by me): Marked sinus bradycardia at 49 bpm.  QTc interval 437 ms.  04/25/2015 ECG (independently read by me):  Sinus bradycardia 53 bpm.  No ectopy.  QTc interval 431 ms.  May 2016ECG (independently read by me): Sinus bradycardia 56 bpm.  Normal intervals.  No ectopy.  December 2015 ECG (independently read by me): Sinus bradycardia 51 bpm with sinus arrhythmia and APC.  QTc interval 431 ms.  October 2015 ECG (independently read by me): Sinus bradycardia 55 beats per minute.  QTc interval 440 ms.  No ST segment changes.  December 2014 ECG: Normal sinus rhythm at 50. PR interval 176 ms, QTc interval 430 ms. No significant ST-T changes.  LABS: BMP Latest Ref Rng & Units 02/04/2016 08/12/2015 03/27/2015  Glucose 65 - 99 mg/dL 95 86 108(H)  BUN 6 - 20 mg/dL 28(H) 27(H) 25  Creatinine 0.44 - 1.00 mg/dL 0.80 0.69 0.62  Sodium 135 - 145 mmol/L 139 140 139  Potassium 3.5 - 5.1 mmol/L 3.9 4.9 4.8  Chloride 101 - 111 mmol/L 107 104 103  CO2 22 - 32 mmol/L _1 Calcium 8.9 - 10.3 mg/dL 9.7 10.1 9.3   Hepatic Function Latest Ref Rng & Units 02/04/2016 08/12/2015 03/27/2015  Total Protein 6.5 - 8.1 g/dL 6.3(L) 6.2 6.5  Albumin 3.5 - 5.0 g/dL 3.7 4.0 4.2  AST 15 - 41 U/L _2 ALT 14 - 54 U/L _3 Alk Phosphatase 38 - 126 U/L 38 39 64  Total Bilirubin 0.3 - 1.2 mg/dL 0.3 0.4 0.4   CBC Latest Ref Rng & Units 02/04/2016 12/13/2014 05/13/2014  WBC 4.0 - 10.5 K/uL 5.0 5.4 5.3  Hemoglobin 12.0 - 15.0 g/dL 11.5(L) 13.1 12.7   Hematocrit 36.0 - 46.0 % 35.5(L) 39.5 37.9  Platelets 150 - 400 K/uL 267 274 250   Lab Results  Component Value Date   MCV 93.2 02/04/2016   MCV 90.4 12/13/2014   MCV 91.1 05/13/2014   Lab Results  Component Value Date   TSH 1.442 12/13/2014  No results found for: HGBA1C  Lipid Panel     Component Value Date/Time   CHOL 202 (H) 08/12/2015 0814   TRIG 113 08/12/2015 0814   HDL 48 08/12/2015 0814   CHOLHDL 4.2 08/12/2015 0814   VLDL 23 08/12/2015 0814   LDLCALC 131 (H) 08/12/2015 0814     RADIOLOGY: No results found.   ASSESSMENT AND PLAN: Ms. Scheiber is a 75 year old female who has a history of sick sinus syndrome and paroxysmal atrial fibrillation.  When I last saw her, she had significant bradycardia and I reduced her sotalol to 80 mg twice a day.  She continues to be bradycardic with heart rate of 49.  An episode of dizziness but was unaware of tachycardia palpitations.  She is not orthostatic.  With reference to her blood pressure today.  I discussed the possibility of reducing her sotalol slightly, but she would prefer to stay on the 80 mg twice a day dose.  She has not had any recurrent PAF.  When I last saw her, she had experience episodes of chest discomfort.  I reviewed her nuclear perfusion study with her in detail, which revealed normal perfusion and hyperdynamic LV function.  His tree of significant hypertriglyceridemia which have improved with fenofibrate.  However, LDL is still elevated.  She cannot take statins and I have recommended the addition of Zetia 10 mg to take in addition to her fenofibrate.  Repeat lipid studies will be obtained in several months.  I will see her in 6 months for cardiology reevaluation.  Time spent: 25 minutes Troy Sine, MD, [facility] 03/03/2016 11:00 PM

## 2016-03-08 ENCOUNTER — Other Ambulatory Visit: Payer: Self-pay | Admitting: Cardiovascular Disease

## 2016-03-10 ENCOUNTER — Other Ambulatory Visit: Payer: Self-pay | Admitting: Cardiovascular Disease

## 2016-03-10 MED ORDER — SOTALOL HCL 80 MG PO TABS: 80.0000 mg | ORAL_TABLET | Freq: Every evening | ORAL | 3 refills | Status: DC

## 2016-03-10 MED ORDER — SOTALOL HCL 120 MG PO TABS: 120.0000 mg | ORAL_TABLET | Freq: Every morning | ORAL | 3 refills | Status: DC

## 2016-03-10 NOTE — Telephone Encounter (Signed)
Routed to coumadin clinic

## 2016-03-15 ENCOUNTER — Telehealth: Payer: Self-pay | Admitting: Cardiovascular Disease

## 2016-03-15 MED ORDER — SOTALOL HCL 120 MG PO TABS: 120.0000 mg | ORAL_TABLET | Freq: Every morning | ORAL | 3 refills | Status: DC

## 2016-03-15 NOTE — Telephone Encounter (Signed)
rx refilled.

## 2016-03-15 NOTE — Telephone Encounter (Signed)
New message   Patient does not understand why 80 mg was called in    *STAT* If patient is at the pharmacy, call can be transferred to refill team.   1. Which medications need to be refilled? (please list name of each medication and dose if known) sotalol  120 mg   2. Which pharmacy/location (including street and city if local pharmacy) is medication to be sent to? [facility] # [id number]  3. Do they need a 30 day or 90 day supply? 90 day supply.   Phone # [phone]  Fax # [phone]

## 2016-04-02 DIAGNOSIS — Z7901 Long term (current) use of anticoagulants: Secondary | ICD-10-CM | POA: Diagnosis not present

## 2016-04-30 DIAGNOSIS — Z7901 Long term (current) use of anticoagulants: Secondary | ICD-10-CM | POA: Diagnosis not present

## 2016-05-31 DIAGNOSIS — H5203 Hypermetropia, bilateral: Secondary | ICD-10-CM | POA: Diagnosis not present

## 2016-05-31 DIAGNOSIS — H43813 Vitreous degeneration, bilateral: Secondary | ICD-10-CM | POA: Diagnosis not present

## 2016-06-01 DIAGNOSIS — E781 Pure hyperglyceridemia: Secondary | ICD-10-CM | POA: Diagnosis not present

## 2016-06-01 DIAGNOSIS — I4891 Unspecified atrial fibrillation: Secondary | ICD-10-CM | POA: Diagnosis not present

## 2016-06-02 LAB — LIPID PANEL
Cholesterol: 167 mg/dL (ref <200)
HDL: 54 mg/dL (ref >50)
LDL CALC: 92 mg/dL
TRIGLYCERIDES: 107 mg/dL (ref <150)
Total CHOL/HDL Ratio: 3.1 Ratio (ref <5.0)
VLDL: 21 mg/dL (ref <30)

## 2016-06-02 LAB — COMPREHENSIVE METABOLIC PANEL
ALBUMIN: 4.3 g/dL (ref 3.6–5.1)
ALT: 22 U/L (ref 6–29)
AST: 26 U/L (ref 10–35)
Alkaline Phosphatase: 33 U/L (ref 33–130)
BUN: 27 mg/dL — ABNORMAL HIGH (ref 7–25)
CHLORIDE: 105 mmol/L (ref 98–110)
CO2: 26 mmol/L (ref 20–31)
CREATININE: 0.78 mg/dL (ref 0.60–0.93)
Calcium: 10.2 mg/dL (ref 8.6–10.4)
Glucose, Bld: 104 mg/dL — ABNORMAL HIGH (ref 65–99)
Potassium: 5.1 mmol/L (ref 3.5–5.3)
SODIUM: 140 mmol/L (ref 135–146)
Total Bilirubin: 0.4 mg/dL (ref 0.2–1.2)
Total Protein: 6.3 g/dL (ref 6.1–8.1)

## 2016-06-26 DIAGNOSIS — R112 Nausea with vomiting, unspecified: Secondary | ICD-10-CM | POA: Diagnosis not present

## 2016-06-26 DIAGNOSIS — Z79899 Other long term (current) drug therapy: Secondary | ICD-10-CM | POA: Diagnosis not present

## 2016-06-26 DIAGNOSIS — E860 Dehydration: Secondary | ICD-10-CM | POA: Diagnosis not present

## 2016-06-26 DIAGNOSIS — N3000 Acute cystitis without hematuria: Secondary | ICD-10-CM | POA: Diagnosis not present

## 2016-06-29 DIAGNOSIS — K219 Gastro-esophageal reflux disease without esophagitis: Secondary | ICD-10-CM | POA: Diagnosis not present

## 2016-06-29 DIAGNOSIS — R112 Nausea with vomiting, unspecified: Secondary | ICD-10-CM | POA: Diagnosis not present

## 2016-06-29 DIAGNOSIS — N390 Urinary tract infection, site not specified: Secondary | ICD-10-CM | POA: Diagnosis not present

## 2016-06-29 DIAGNOSIS — Z1211 Encounter for screening for malignant neoplasm of colon: Secondary | ICD-10-CM | POA: Diagnosis not present

## 2016-06-30 DIAGNOSIS — S60319A Abrasion of unspecified thumb, initial encounter: Secondary | ICD-10-CM | POA: Diagnosis not present

## 2016-06-30 DIAGNOSIS — L089 Local infection of the skin and subcutaneous tissue, unspecified: Secondary | ICD-10-CM | POA: Diagnosis not present

## 2016-06-30 DIAGNOSIS — Z7901 Long term (current) use of anticoagulants: Secondary | ICD-10-CM | POA: Diagnosis not present

## 2016-07-07 DIAGNOSIS — Z7901 Long term (current) use of anticoagulants: Secondary | ICD-10-CM | POA: Diagnosis not present

## 2016-07-21 DIAGNOSIS — Z7901 Long term (current) use of anticoagulants: Secondary | ICD-10-CM | POA: Diagnosis not present

## 2016-07-29 ENCOUNTER — Encounter: Payer: Self-pay | Admitting: *Deleted

## 2016-08-02 IMAGING — NM NM MISC PROCEDURE
6 series · 36 of 36 positions shown · non-contrast
Comparison: none

[Series 1: wbr rest · 6.40mm/px · 6 of 64 frames shown]
[frame 6/64]
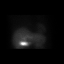
[frame 16/64]
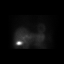
[frame 27/64]
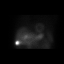
[frame 38/64]
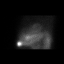
[frame 48/64]
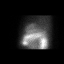
[frame 59/64]
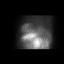

[Series 1: wbr_r-proj_st wbr rest · 6.40mm/px · 6 of 64 frames shown]
[frame 6/64]
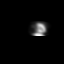
[frame 16/64]
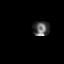
[frame 27/64]
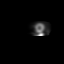
[frame 38/64]
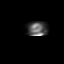
[frame 48/64]
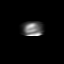
[frame 59/64]
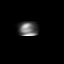

[Series 2: wbr_s-proj_st wbr stress-gsp · 6.40mm/px · 6 of 512 frames shown]
[frame 43/512]
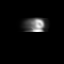
[frame 128/512]
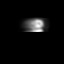
[frame 214/512]
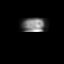
[frame 299/512]
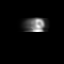
[frame 384/512]
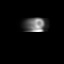
[frame 470/512]
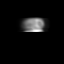

[Series 2: wbr stress-gsp · 6.40mm/px · 6 of 512 frames shown]
[frame 43/512]
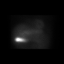
[frame 128/512]
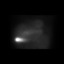
[frame 214/512]
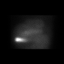
[frame 299/512]
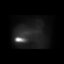
[frame 384/512]
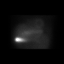
[frame 470/512]
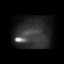

[Series 3: wbr_s-proj_st wbr stress-sum-em · 6.40mm/px · 6 of 64 frames shown]
[frame 6/64]
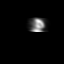
[frame 16/64]
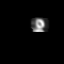
[frame 27/64]
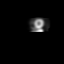
[frame 38/64]
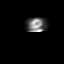
[frame 48/64]
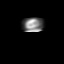
[frame 59/64]
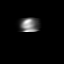

[Series 3: wbr stress-sum-em · 6.40mm/px · 6 of 64 frames shown]
[frame 6/64]
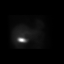
[frame 16/64]
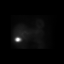
[frame 27/64]
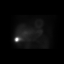
[frame 38/64]
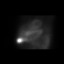
[frame 48/64]
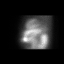
[frame 59/64]
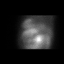

[36 of 36 positions shown; findings below may reference images not displayed]

Canned report from images found in remote index.

Refer to host system for actual result text.

## 2016-08-10 DIAGNOSIS — Z1212 Encounter for screening for malignant neoplasm of rectum: Secondary | ICD-10-CM | POA: Diagnosis not present

## 2016-08-10 DIAGNOSIS — Z1211 Encounter for screening for malignant neoplasm of colon: Secondary | ICD-10-CM | POA: Diagnosis not present

## 2016-08-20 DIAGNOSIS — Z7901 Long term (current) use of anticoagulants: Secondary | ICD-10-CM | POA: Diagnosis not present

## 2016-09-05 ENCOUNTER — Telehealth: Payer: Self-pay | Admitting: Cardiology

## 2016-09-05 NOTE — Telephone Encounter (Signed)
Paged because patient woke up and realized she had not taken either her warfarin or fenofibrate last night. She asked if she should take these medications this morning. I told her it was fine to take both this AM.

## 2016-09-07 DIAGNOSIS — R262 Difficulty in walking, not elsewhere classified: Secondary | ICD-10-CM | POA: Diagnosis not present

## 2016-09-07 DIAGNOSIS — I495 Sick sinus syndrome: Secondary | ICD-10-CM | POA: Diagnosis not present

## 2016-09-07 DIAGNOSIS — Z13820 Encounter for screening for osteoporosis: Secondary | ICD-10-CM | POA: Diagnosis not present

## 2016-09-07 DIAGNOSIS — I480 Paroxysmal atrial fibrillation: Secondary | ICD-10-CM | POA: Diagnosis not present

## 2016-09-07 DIAGNOSIS — K219 Gastro-esophageal reflux disease without esophagitis: Secondary | ICD-10-CM | POA: Diagnosis not present

## 2016-09-09 ENCOUNTER — Encounter: Payer: Self-pay | Admitting: Cardiovascular Disease

## 2016-09-09 ENCOUNTER — Ambulatory Visit (INDEPENDENT_AMBULATORY_CARE_PROVIDER_SITE_OTHER): Payer: Medicare Other | Admitting: Cardiovascular Disease

## 2016-09-09 VITALS — BP 100/66 | HR 52 | Ht 63.0 in | Wt 120.0 lb

## 2016-09-09 DIAGNOSIS — E782 Mixed hyperlipidemia: Secondary | ICD-10-CM

## 2016-09-09 DIAGNOSIS — Z7901 Long term (current) use of anticoagulants: Secondary | ICD-10-CM | POA: Diagnosis not present

## 2016-09-09 DIAGNOSIS — I480 Paroxysmal atrial fibrillation: Secondary | ICD-10-CM

## 2016-09-09 DIAGNOSIS — R0789 Other chest pain: Secondary | ICD-10-CM

## 2016-09-09 DIAGNOSIS — I340 Nonrheumatic mitral (valve) insufficiency: Secondary | ICD-10-CM

## 2016-09-09 NOTE — Patient Instructions (Addendum)
Your physician wants you to follow-up in: 1 year or sooner if needed. You will receive a reminder letter in the mail two months in advance. If you don't receive a letter, please call our office to schedule the follow-up appointment.   If you need a refill on your cardiac medications before your next appointment, please call your pharmacy.   

## 2016-09-09 NOTE — Progress Notes (Signed)
Patient ID: Julie Kemp, female   DOB: Jul 29, 1940, 76 y.o.   MRN: [id number]    Primary M.D.: Dr. Janace Litten  HPI: Julie Kemp  is a 76 year old is a former patient of Dr. Rollene Fare who established care with me after his retirement.  She presents for a 6 month follow-up evaluation  Julie Kemp has a history of sick sinus syndrome with PAF and has been on chronic sotalol therapy. She also has a history of anemia and diverticular disease. She has been on chronic Coumadin anticoagulation therapy. An echo Doppler study in August 2014 showed mild left ventricular hypertrophy with ejection fraction of 55-60% without regional wall motion abnormalities. She did have mitral annular calcification with moderate mitral regurgitation directed centrally. She had mild-to-moderate LA dilatation and mild RA dilatation. In 2010 a nuclear perfusion study showed fairly normal perfusion. In May 2014 she underwent carotid studies which were essentially normal.  When I initially saw her in October 2015 she was noticing more  palpitations that would often last up to 30 seconds. At that time, I recommended that she titrate her sotalol from 120 mg in the morning and 80 mg at night to 120 mg twice a day. Laboratory demonstrated a normal magnesium level at 2.3. She had normal electrolytes and previously had normal thyroid function. Her hemoglobin was 12.4 hematocrit 37.4 She also has noted some shortness of breath.    She has a history of GERD and has been on chronic omeprazole 20 mg.  Recently she's noticed increasing heartburn symptoms.  There is mild shortness of breath with activity.  Her last stress test was 5-1/2 years ago.  She has seen Dr.Yan for imbalance issues and ultimately was referred to Dr. Dimas Millin in [place]. She was taken off her statin therapy by Dr. Dimas Millin due to concerns of hip osteopenia.  However, she is still on proton pump inhibition, which has not been altered and may affect calcium was  stasis.  A nuclear perfusion study in October 2015  was low risk and showed a small, mild intensity fixed anterior defect consistent with soft tissue attenuation without scar or ischemia.  She had normal wall motion and LV function.  Ejection fraction was 68%.    Last year, when she was on atorvastatin her total cholesterol was 177 and when I last saw her when she was off statin therapy cholesterol had increased to 274.  Her triglycerides have increased from 189 to  311.  LDL has increased from 96 to177.  She is on sotalol 120 mg twice a day.  She also is on iron supplementation. She had been back on Zetia but again, stopped taking Zetia. She is on fenofibrate and the counter fish oil capsules. We had given her samples of Vascepa, but she did not fill the prescription due to cost she continues to be on anticoagulation.   When I  saw her in 1976, she complained of intermittent chest discomfort associated with mild shortness of breath.  She deniesd any palpitations.  She underwent a nuclear perfusion study on April 20 117 which was normal.  Ejection fraction was greater than 65%.  Since I last saw her, she states that she had a wellness checkup, earlier this year by Dr. Unk Lightning.  She has been taking sotalol 120 mg in the morning and 80 mg at night and continues to be on warfarin for anticoagulation.  I had reduced this to 80 mg twice a day.  Due to her bradycardia, but apparently she has  been taking at the present stated dose. A 2.5 mg depending upon her INR level.  She has been maintained on Zetia 10 mg and fenofibrate 160 mg for hyperlipidemia and is statin intolerant.  She continues to be on pantoprazole 40 mg for GERD.  She presents for reevaluation   Past Surgical History:  Procedure Laterality Date  . ABDOMINAL HYSTERECTOMY    . Left femoral hernia repair    . lymph nodes     left groin lymph node biopsies for benign reasons  . RECTOCELE REPAIR      Allergies  Allergen Reactions  .  Zithromax [Azithromycin] Nausea And Vomiting  . Amoxicillin Other (See Comments)    Can take Ceftin-per note  . Sulfa Antibiotics Nausea Only    Current Outpatient Prescriptions  Medication Sig Dispense Refill  . Calcium Carbonate-Vit D-Min (CALCIUM 1200 PO) Take 1 tablet by mouth 2 (two) times daily.    . chlorhexidine (PERIDEX) 0.12 % solution Use as directed 5 mLs in the mouth or throat daily. Rinse as directed    . dimenhyDRINATE (DRAMAMINE) 50 MG tablet Take 150-300 mg by mouth daily.    . fenofibrate 160 MG tablet Take 1 tablet by mouth  daily (Patient taking differently: Take 1 tablet by mouth at night) 90 tablet 3  . FERREX 150 150 MG capsule TAKE 1 CAPSULE BY MOUTH ONCE DAILY 100 capsule 5  . Multiple Vitamin (MULTIVITAMIN WITH MINERALS) TABS tablet Take 1 tablet by mouth daily.    . Omega-3 Fatty Acids (FISH OIL PO) Take 2 capsules by mouth 2 (two) times daily. Taking Nature Bounty 1200 mg    . pantoprazole (PROTONIX) 40 MG tablet Take 1 tablet by mouth  daily 90 tablet 3  . polyethylene glycol (MIRALAX / GLYCOLAX) packet Take 17 g by mouth daily.    . Probiotic Product (PROBIOTIC DAILY) CAPS Take 1 capsule by mouth every evening.    . sotalol (BETAPACE) 120 MG tablet Take 1 tablet (120 mg total) by mouth every morning. (Patient taking differently: Take 120 mg by mouth daily. ) 90 tablet 3  . sotalol (BETAPACE) 80 MG tablet Take 80 mg by mouth at bedtime.    . temazepam (RESTORIL) 15 MG capsule Take 1 capsule (15 mg total) by mouth at bedtime as needed for sleep. 10 capsule 0  . warfarin (COUMADIN) 5 MG tablet Take 1 tablet by mouth daily or as directed by coumadin clinic (Patient taking differently: Take 2.5 mg by mouth daily at 6 PM. Take 1 tablet by mouth daily or as directed by coumadin clinic) 90 tablet 1  . ezetimibe (ZETIA) 10 MG tablet Take 1 tablet (10 mg total) by mouth daily. 90 tablet 3   No current facility-administered medications for this visit.     Social history  is notable in that she is married and has 3 children 4 grandchildren. He is no history of tobacco use. She has worked [employer]or, [employer].  Family History  Problem Relation Age of Onset  . Heart failure Mother   . Heart failure Father   . Diabetes Father    ROS General: Negative; No fevers, chills, or night sweats;  HEENT: Negative; No changes in vision or hearing, sinus congestion, difficulty swallowing Pulmonary: Negative; No cough, wheezing, shortness of breath, hemoptysis Cardiovascular: Negative; No chest pain, presyncope, syncope, palpitations GI: Positive for GERD and diverticular disease; No nausea, vomiting, diarrhea, or abdominal pain GU: Negative; No dysuria, hematuria, or difficulty voiding Musculoskeletal: Positive for osteopenia  and fractures of the vertebrae in her back Hematologic/Oncology: Negative; no easy bruising, bleeding Endocrine: Negative; no heat/cold intolerance; no diabetes Neuro: Negative; no changes in balance, headaches Skin: Negative; No rashes or skin lesions Psychiatric: Negative; No behavioral problems, depression Sleep: Negative; No snoring, daytime sleepiness, hypersomnolence, bruxism, restless legs, hypnogognic hallucinations, no cataplexy Other comprehensive 14 point system review is negative.   PE BP 100/66   Pulse (!) 52   Ht '5\' 3"'$  (1.6 m)   Wt 120 lb (54.4 kg)   BMI 21.26 kg/m    Repeat blood pressure by me was 100/60 supine and 96/60 standing.  Wt Readings from Last 3 Encounters:  09/09/16 120 lb (54.4 kg)  03/01/16 128 lb (58.1 kg)  02/04/16 128 lb (58.1 kg)   General: Alert, oriented, no distress.  Skin: normal turgor, no rashes HEENT: Normocephalic, atraumatic. Pupils round and reactive; sclera anicteric; no nystagmus; extraocular muscles are full Fundi  no AV nicking or arteriolar narrowing. No hemorrhages or exudates.  Nose without nasal septal hypertrophy Mouth/Parynx benign; Mallinpatti scale 3 Neck: No JVD, no  carotid bruits; normal carotid upstroke. No bisferens pulse Lungs: clear to ausculatation and percussion; no wheezing or rales Chest wall: without tenderness to palpitation Heart: RRR, s1 s2 normal 7-5/1 systolic murmur left sternal border; no diastolic murmur.  No rubs, thrills or heaves. Abdomen: soft, nontender; no hepatosplenomehaly, BS+; abdominal aorta nontender and not dilated by palpation. Back: no CVA tenderness Pulses 2+ Extremities: no clubbing cyanosis or edema, Homan's sign negative  Neurologic: grossly nonfocal; Cranial nerves grossly wnl, Specifically, she had a fairly normal finger to nose test. She also had fairly normal rapid alternating movements. Psychologic: Normal mood and affect  ECG (independently read by me): Sinus bradycardia 52 bpm.  Normal intervals.  No ST segment changes.  August 2017 ECG (independently read by me): Sinus bradycardia at 49 bpm.  QTc interval 426 ms.  ECG (independently read by me): Marked sinus bradycardia at 49 bpm.  QTc interval 437 ms.  04/25/2015 ECG (independently read by me):  Sinus bradycardia 53 bpm.  No ectopy.  QTc interval 431 ms.  May 2016ECG (independently read by me): Sinus bradycardia 56 bpm.  Normal intervals.  No ectopy.  December 2015 ECG (independently read by me): Sinus bradycardia 51 bpm with sinus arrhythmia and APC.  QTc interval 431 ms.  October 2015 ECG (independently read by me): Sinus bradycardia 55 beats per minute.  QTc interval 440 ms.  No ST segment changes.  December 2014 ECG: Normal sinus rhythm at 50. PR interval 176 ms, QTc interval 430 ms. No significant ST-T changes.  LABS: BMP Latest Ref Rng & Units 06/01/2016 02/04/2016 08/12/2015  Glucose 65 - 99 mg/dL 104(H) 95 86  BUN 7 - 25 mg/dL 27(H) 28(H) 27(H)  Creatinine 0.60 - 0.93 mg/dL 0.78 0.80 0.69  Sodium 135 - 146 mmol/L 140 139 140  Potassium 3.5 - 5.3 mmol/L 5.1 3.9 4.9  Chloride 98 - 110 mmol/L 105 107 104  CO2 20 - 31 mmol/L '26 26 27  '$ Calcium 8.6  - 10.4 mg/dL 10.2 9.7 10.1   Hepatic Function Latest Ref Rng & Units 06/01/2016 02/04/2016 08/12/2015  Total Protein 6.1 - 8.1 g/dL 6.3 6.3(L) 6.2  Albumin 3.6 - 5.1 g/dL 4.3 3.7 4.0  AST 10 - 35 U/L '26 22 22  '$ ALT 6 - 29 U/L '22 21 23  '$ Alk Phosphatase 33 - 130 U/L 33 38 39  Total Bilirubin 0.2 - 1.2 mg/dL 0.4 0.3  0.4   CBC Latest Ref Rng & Units 02/04/2016 12/13/2014 05/13/2014  WBC 4.0 - 10.5 K/uL 5.0 5.4 5.3  Hemoglobin 12.0 - 15.0 g/dL 11.5(L) 13.1 12.7  Hematocrit 36.0 - 46.0 % 35.5(L) 39.5 37.9  Platelets 150 - 400 K/uL 267 274 250   Lab Results  Component Value Date   MCV 93.2 02/04/2016   MCV 90.4 12/13/2014   MCV 91.1 05/13/2014   Lab Results  Component Value Date   TSH 1.442 12/13/2014  No results found for: HGBA1C   Lipid Panel     Component Value Date/Time   CHOL 167 06/01/2016 0900   TRIG 107 06/01/2016 0900   HDL 54 06/01/2016 0900   CHOLHDL 3.1 06/01/2016 0900   VLDL 21 06/01/2016 0900   LDLCALC 92 06/01/2016 0900     RADIOLOGY: No results found.  IMPRESSION:  1. PAF (paroxysmal atrial fibrillation) ([place])   2. Long term current use of anticoagulant therapy   3. Mixed hyperlipidemia   4. Atypical chest pain   5. Mitral valve insufficiency, unspecified etiology     ASSESSMENT AND PLAN: Julie Kemp is a 76 year-old female who has a history of sick sinus syndrome and paroxysmal atrial fibrillation.  When I had previously seen her, she had significant bradycardia and I reduced her sotalol to 80 mg twice a day.  She continues to be bradycardic with heart rate of 52.  He is unaware of any recurrent episodes of bradycardia and I would recommend reducing her sotalol dose to 80 twice a day.  She has had some remote atypical chest pain and her  nuclear perfusion study  revealed normal perfusion and hyperdynamic LV function.  She has a history of significant hypertriglyceridemia which have improved with fenofibrate.  However, when I last saw her, her LDL was still  elevated and Zetia was added to her medical regimen.  Reviewed her recent lab work from November 2017.  This was significant improved with a total cholesterol of 167, triglycerides 107, HDL 54, VLDL 21, and her LDL cholesterol had reduced from 131, down to 92.  Long as she remains stable, I recommended that she return in one year for follow-up cardiology evaluation  Time spent: 25 minutes Troy Sine, MD, [facility] 09/10/2016 8:02 AM

## 2016-09-10 ENCOUNTER — Telehealth: Payer: Self-pay | Admitting: Cardiovascular Disease

## 2016-09-10 ENCOUNTER — Telehealth: Payer: Self-pay | Admitting: *Deleted

## 2016-09-10 ENCOUNTER — Other Ambulatory Visit: Payer: Self-pay | Admitting: *Deleted

## 2016-09-10 MED ORDER — SOTALOL HCL 80 MG PO TABS: 80.0000 mg | ORAL_TABLET | Freq: Two times a day (BID) | ORAL | 3 refills | Status: DC

## 2016-09-10 NOTE — Telephone Encounter (Signed)
Returned a call to patient.  

## 2016-09-10 NOTE — Telephone Encounter (Signed)
Sotalol refill sent to [facility] with new SIG per Dr Tresa Endokelly.

## 2016-09-10 NOTE — Telephone Encounter (Signed)
new message ° ° °pt verbalized that she is calling for the rn to return her call °

## 2016-09-10 NOTE — Telephone Encounter (Signed)
Left a message for patient to return a call to me. Dr Tresa Endokelly wants to change her sotalol dose.

## 2016-09-10 NOTE — Telephone Encounter (Signed)
Patient returned a call to me. I informed her per Dr Tresa Endokelly to decrease her sotalol down to 80 mg twice a day. She voiced understanding of these instructions saying that she will try it. New prescription sent to patient's mail order pharmacy per her request.

## 2016-09-10 NOTE — Telephone Encounter (Signed)
Message routed to Wanda, CMA 

## 2016-09-13 NOTE — Addendum Note (Signed)
Addended by: Raelyn NumberWILLIAMSON, Arzell Mcgeehan L on: 09/13/2016 08:32 AM   Modules accepted: Orders

## 2016-09-21 DIAGNOSIS — M810 Age-related osteoporosis without current pathological fracture: Secondary | ICD-10-CM | POA: Diagnosis not present

## 2016-10-07 ENCOUNTER — Telehealth: Payer: Self-pay | Admitting: Cardiovascular Disease

## 2016-10-07 NOTE — Telephone Encounter (Signed)
New Message  Pt voiced wanting to speak with nurse with her dizziness possibly due to a dosage change in one of her medications.  Please f/u

## 2016-10-07 NOTE — Telephone Encounter (Signed)
Spoke with pt, her sotalol was decreased to 80 mg twice daily when she saw dr Tresa Endokelly in February due to bradycardia, since that time she had been having dizziness.she has a hx of dizziness but reports it is happening more frequently. It does not occur when standing from sitting, will occur while sitting or at times with walking. She is unable to turn around or turn her head to look for traffic without dizziness. She is unable to check her bp or pulse. Explained it sounded more like vertigo than bp or pulse related. She does take dramamine but has only been taking it twice daily when it is recommended after every meal. I advised the patient to increase the dramamine back to three times daily and I will let dr Tresa Endokelly know what is going on but we would not increase the sotalol back without knowing her pulse. Patient voiced understanding and agreed with this plan.

## 2016-10-11 DIAGNOSIS — Z7901 Long term (current) use of anticoagulants: Secondary | ICD-10-CM | POA: Diagnosis not present

## 2016-10-18 DIAGNOSIS — Z7901 Long term (current) use of anticoagulants: Secondary | ICD-10-CM | POA: Diagnosis not present

## 2016-10-18 DIAGNOSIS — Z1231 Encounter for screening mammogram for malignant neoplasm of breast: Secondary | ICD-10-CM | POA: Diagnosis not present

## 2016-11-05 IMAGING — DX DG CHEST 2V
2 series · 2 of 2 positions shown · non-contrast
Comparison: 08/21/2014

CLINICAL DATA: Eyes aren't focusing/glazing over and feels like she
could pass out/SOB x [REDACTED], HA x today, prediabetic, hx AFIB

EXAM:
CHEST  2 VIEW

[w chest lat]
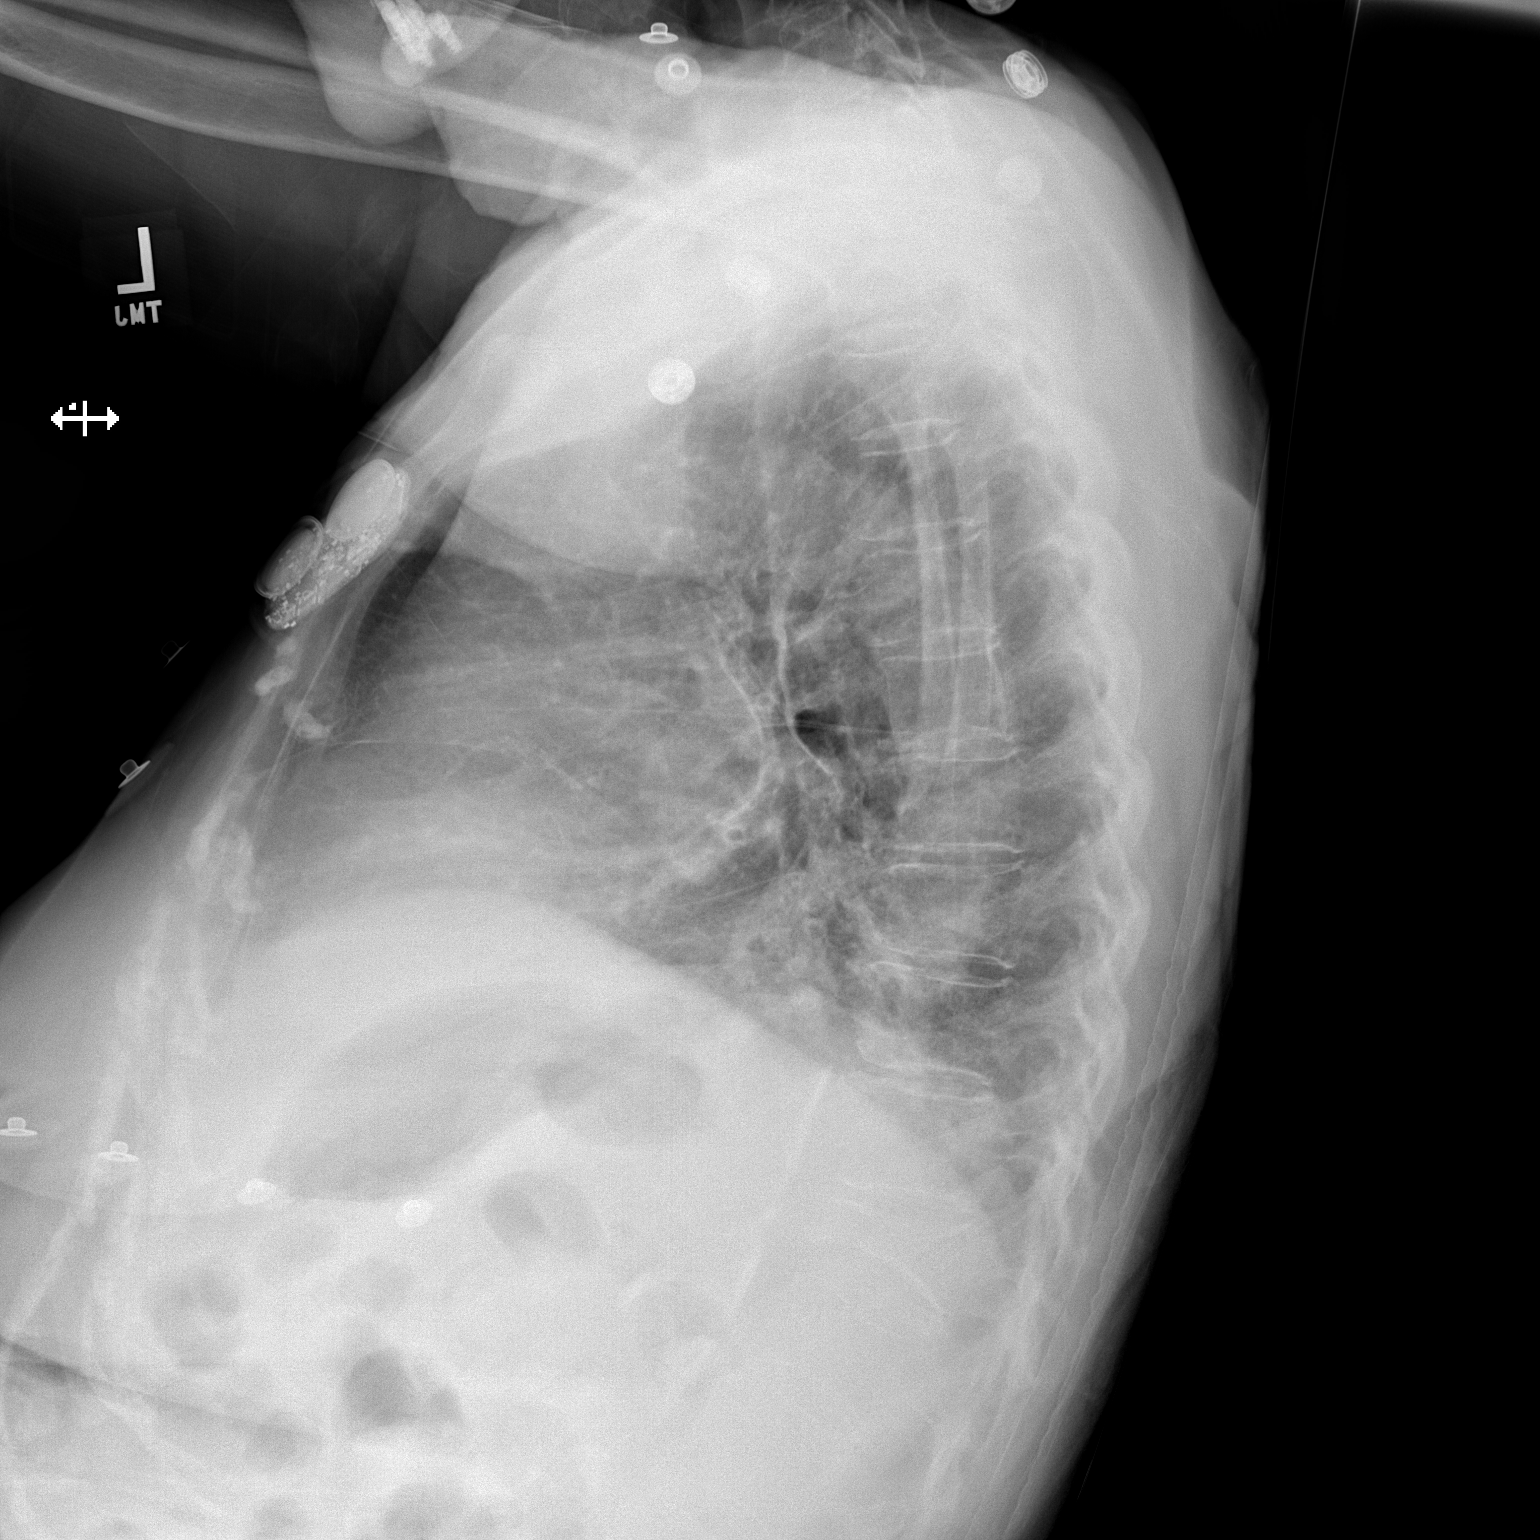

[x chest ap]
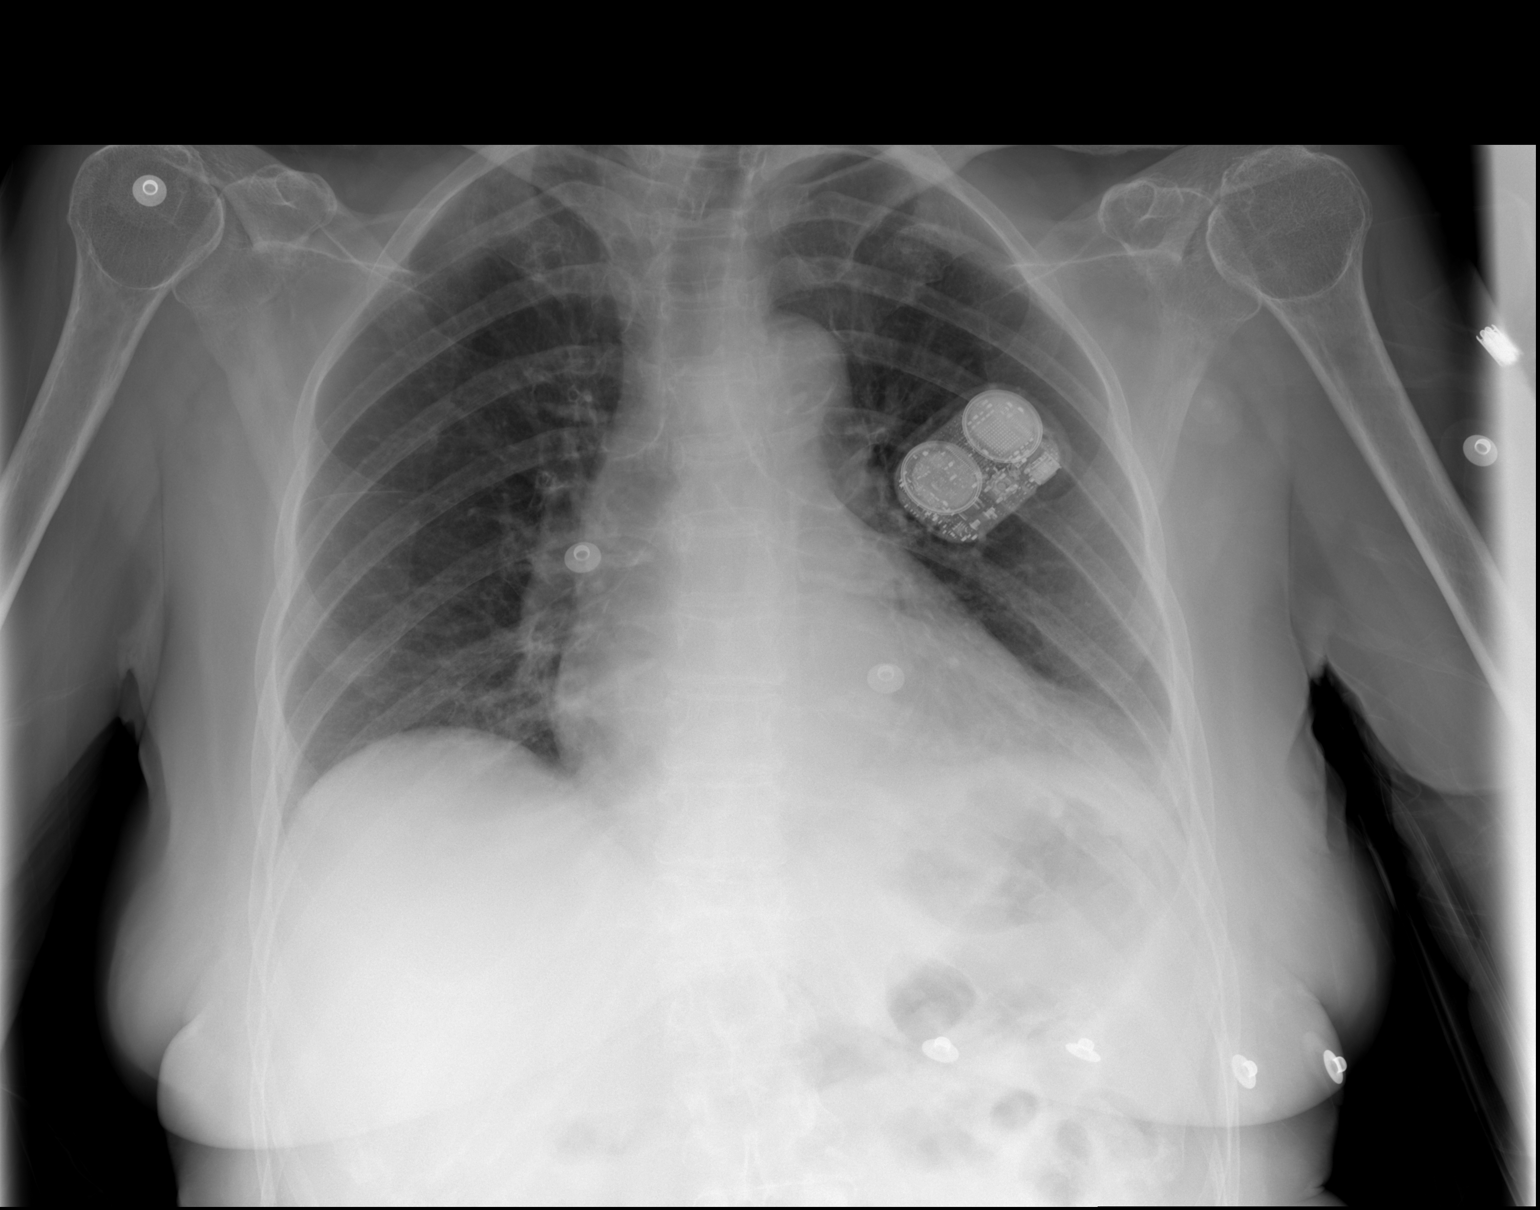

[2 of 2 positions shown; findings below may reference images not displayed]

FINDINGS: There is hazy left lower lobe airspace disease which may reflect
atelectasis versus pneumonia. There is no pleural effusion or
pneumothorax. The heart and mediastinal contours are unremarkable.

There is a chronic T11 vertebral body compression fracture.
IMPRESSION: There is hazy left lower lobe airspace disease which may reflect
atelectasis versus pneumonia.

## 2016-11-18 DIAGNOSIS — Z7901 Long term (current) use of anticoagulants: Secondary | ICD-10-CM | POA: Diagnosis not present

## 2016-11-25 DIAGNOSIS — Z7901 Long term (current) use of anticoagulants: Secondary | ICD-10-CM | POA: Diagnosis not present

## 2016-12-09 DIAGNOSIS — Z7901 Long term (current) use of anticoagulants: Secondary | ICD-10-CM | POA: Diagnosis not present

## 2016-12-24 ENCOUNTER — Other Ambulatory Visit: Payer: Self-pay | Admitting: Cardiovascular Disease

## 2016-12-24 ENCOUNTER — Other Ambulatory Visit: Payer: Self-pay | Admitting: *Deleted

## 2016-12-24 MED ORDER — POLYSACCHARIDE IRON COMPLEX 150 MG PO CAPS: 150.0000 mg | ORAL_CAPSULE | Freq: Every day | ORAL | 1 refills | Status: DC

## 2016-12-24 NOTE — Telephone Encounter (Signed)
REFILL 

## 2016-12-27 DIAGNOSIS — K219 Gastro-esophageal reflux disease without esophagitis: Secondary | ICD-10-CM | POA: Diagnosis not present

## 2016-12-27 DIAGNOSIS — R195 Other fecal abnormalities: Secondary | ICD-10-CM | POA: Diagnosis not present

## 2016-12-27 DIAGNOSIS — K5730 Diverticulosis of large intestine without perforation or abscess without bleeding: Secondary | ICD-10-CM | POA: Diagnosis not present

## 2016-12-27 DIAGNOSIS — K921 Melena: Secondary | ICD-10-CM | POA: Diagnosis not present

## 2016-12-27 DIAGNOSIS — Z7901 Long term (current) use of anticoagulants: Secondary | ICD-10-CM | POA: Diagnosis not present

## 2016-12-27 DIAGNOSIS — J45909 Unspecified asthma, uncomplicated: Secondary | ICD-10-CM | POA: Diagnosis not present

## 2016-12-27 DIAGNOSIS — K648 Other hemorrhoids: Secondary | ICD-10-CM | POA: Diagnosis not present

## 2016-12-27 DIAGNOSIS — I480 Paroxysmal atrial fibrillation: Secondary | ICD-10-CM | POA: Diagnosis not present

## 2016-12-30 ENCOUNTER — Other Ambulatory Visit: Payer: Self-pay | Admitting: Cardiovascular Disease

## 2017-01-10 DIAGNOSIS — Z7901 Long term (current) use of anticoagulants: Secondary | ICD-10-CM | POA: Diagnosis not present

## 2017-02-11 ENCOUNTER — Other Ambulatory Visit: Payer: Self-pay | Admitting: Cardiovascular Disease

## 2017-02-16 ENCOUNTER — Telehealth: Payer: Self-pay | Admitting: Cardiovascular Disease

## 2017-02-16 NOTE — Telephone Encounter (Signed)
Patient currently takes warfarin . Start Ginko Biloba may  significantly increase risk for bleeding.   Patient will need close INR monitoring. She follows INR with PCP.

## 2017-02-16 NOTE — Telephone Encounter (Signed)
New message    Pt is calling to ask if she can take Ginkgo Biloba 120 mg. She said she got it from the [facility] to help with off balance. She was taking Dramamine but it was not working.

## 2017-02-16 NOTE — Telephone Encounter (Signed)
Routed to pharmacy to determine if safe to take supplement w/current medications

## 2017-02-16 NOTE — Telephone Encounter (Signed)
Patient notified of recommendations and she states she will not take this supplement.

## 2017-02-17 DIAGNOSIS — Z7901 Long term (current) use of anticoagulants: Secondary | ICD-10-CM | POA: Diagnosis not present

## 2017-02-18 ENCOUNTER — Telehealth: Payer: Self-pay | Admitting: Cardiovascular Disease

## 2017-02-18 NOTE — Telephone Encounter (Signed)
Would not recommend because it contains eluthero (siberian ginseng) which can increase INR and licorice which can increase BP.   She would need to be closely monitored on her INR and BP.  (She asked about ginko two days ago, it can also increase INR

## 2017-02-18 NOTE — Telephone Encounter (Signed)
Returned call to patient-patient aware of recommendations and verbalized understanding.   Patient asking what she can take to stop "the part of my brain that is deteriorating".   Advised to contact PCP or neurologist for recommendations. Patient agreed.

## 2017-02-18 NOTE — Telephone Encounter (Signed)
Patient calling, states that she was Adrenaplex at the [facility] for her "off balance issue." Patient has some questions about the Adrenaplex and if it is okay to take. Please call to discuss, thanks.

## 2017-03-04 ENCOUNTER — Telehealth: Payer: Self-pay | Admitting: Cardiovascular Disease

## 2017-03-04 NOTE — Telephone Encounter (Signed)
Spoke with pt, explained she will probably need lab work prior to her yearly appointment with dr Tresa Endokelly in feb.

## 2017-03-04 NOTE — Telephone Encounter (Signed)
New message       Pt is calling asking when is she due to have labs drawn?

## 2017-03-21 ENCOUNTER — Telehealth: Payer: Self-pay | Admitting: Cardiovascular Disease

## 2017-03-21 DIAGNOSIS — Z79899 Other long term (current) drug therapy: Secondary | ICD-10-CM

## 2017-03-21 DIAGNOSIS — Z1329 Encounter for screening for other suspected endocrine disorder: Secondary | ICD-10-CM

## 2017-03-21 DIAGNOSIS — Z7901 Long term (current) use of anticoagulants: Secondary | ICD-10-CM | POA: Diagnosis not present

## 2017-03-21 DIAGNOSIS — E781 Pure hyperglyceridemia: Secondary | ICD-10-CM

## 2017-03-21 NOTE — Telephone Encounter (Signed)
Lipid, CMET, CBC, TSH ordered Lab slips mailed to patient Patient will have labs at PCP office

## 2017-03-21 NOTE — Telephone Encounter (Signed)
New message   Pt is calling asking for a call back. She is calling about her lab work she needs before her yearly appt in Jan. Please call.

## 2017-04-04 ENCOUNTER — Telehealth: Payer: Self-pay | Admitting: Cardiovascular Disease

## 2017-04-04 NOTE — Telephone Encounter (Signed)
New message     Pt wants to know if she can take her medication before her lab work

## 2017-04-04 NOTE — Telephone Encounter (Signed)
Returned call advised pt on instructions for having AM fasting labs - no food or fluids besides water, may & should drink water at normal amts & take her scheduled medications as usual.  Pt expressed understanding and thanks.

## 2017-04-06 DIAGNOSIS — Z0000 Encounter for general adult medical examination without abnormal findings: Secondary | ICD-10-CM | POA: Diagnosis not present

## 2017-04-06 DIAGNOSIS — Z9181 History of falling: Secondary | ICD-10-CM | POA: Diagnosis not present

## 2017-04-06 DIAGNOSIS — Z1389 Encounter for screening for other disorder: Secondary | ICD-10-CM | POA: Diagnosis not present

## 2017-04-06 DIAGNOSIS — Z6820 Body mass index (BMI) 20.0-20.9, adult: Secondary | ICD-10-CM | POA: Diagnosis not present

## 2017-04-21 DIAGNOSIS — Z7901 Long term (current) use of anticoagulants: Secondary | ICD-10-CM | POA: Diagnosis not present

## 2017-04-21 DIAGNOSIS — E782 Mixed hyperlipidemia: Secondary | ICD-10-CM | POA: Diagnosis not present

## 2017-04-21 DIAGNOSIS — I495 Sick sinus syndrome: Secondary | ICD-10-CM | POA: Insufficient documentation

## 2017-04-21 DIAGNOSIS — I480 Paroxysmal atrial fibrillation: Secondary | ICD-10-CM | POA: Diagnosis not present

## 2017-04-26 DIAGNOSIS — R195 Other fecal abnormalities: Secondary | ICD-10-CM | POA: Diagnosis not present

## 2017-05-16 DIAGNOSIS — Z6820 Body mass index (BMI) 20.0-20.9, adult: Secondary | ICD-10-CM | POA: Insufficient documentation

## 2017-05-16 DIAGNOSIS — F419 Anxiety disorder, unspecified: Secondary | ICD-10-CM | POA: Insufficient documentation

## 2017-05-16 DIAGNOSIS — Z9181 History of falling: Secondary | ICD-10-CM | POA: Insufficient documentation

## 2017-05-16 DIAGNOSIS — Z0000 Encounter for general adult medical examination without abnormal findings: Secondary | ICD-10-CM | POA: Insufficient documentation

## 2017-05-16 DIAGNOSIS — J4530 Mild persistent asthma, uncomplicated: Secondary | ICD-10-CM | POA: Insufficient documentation

## 2017-05-16 DIAGNOSIS — K5909 Other constipation: Secondary | ICD-10-CM | POA: Insufficient documentation

## 2017-05-16 DIAGNOSIS — R262 Difficulty in walking, not elsewhere classified: Secondary | ICD-10-CM | POA: Insufficient documentation

## 2017-05-16 DIAGNOSIS — S22000A Wedge compression fracture of unspecified thoracic vertebra, initial encounter for closed fracture: Secondary | ICD-10-CM | POA: Insufficient documentation

## 2017-05-16 DIAGNOSIS — E782 Mixed hyperlipidemia: Secondary | ICD-10-CM | POA: Insufficient documentation

## 2017-05-16 DIAGNOSIS — M810 Age-related osteoporosis without current pathological fracture: Secondary | ICD-10-CM | POA: Insufficient documentation

## 2017-05-16 DIAGNOSIS — F5104 Psychophysiologic insomnia: Secondary | ICD-10-CM | POA: Insufficient documentation

## 2017-06-22 ENCOUNTER — Other Ambulatory Visit: Payer: Self-pay | Admitting: Cardiovascular Disease

## 2017-07-20 ENCOUNTER — Telehealth: Payer: Self-pay | Admitting: Cardiovascular Disease

## 2017-07-20 NOTE — Telephone Encounter (Signed)
New Message     Patient c/o Palpitations:  High priority if patient c/o lightheadedness, shortness of breath, or chest pain  1) How long have you had palpitations/irregular HR/ Afib? Are you having the symptoms now? A few weeks ago   2) Are you currently experiencing lightheadedness, SOB or CP?  No, she has sob with exertion   3) Do you have a history of afib (atrial fibrillation) or irregular heart rhythm? Yes   4) Have you checked your BP or HR? (document readings if available): no  5) Are you experiencing any other symptoms? She states she is having these events when she goes to be at night.  If she over exerts her self she she will have them in the day time.

## 2017-07-20 NOTE — Telephone Encounter (Signed)
Returned call to patient. Patient reports she is experiencing episodes of afib at night when she lays down to go to sleep.   States she has never had episodes at night, usually only during the day with exertion.    States as soon as she lays down she feels like her heart is racing.  Has not taken BP or HR when this occurs, states she has to lay still until it passes (1-2 mins).  Reports SOB at times, denies CP, dizziness.   Denies increase in caffeine, possibly increase in stress due to holidays.   States this does not happen every night.   Denies symptoms at current.  Patient is complaint with current medications.   offered APP appt Friday, patient declined and would like to see Dr. Tresa EndoKelly only.   First available with Dr. Tresa EndoKelly scheduled 1/11.   Advised if symptoms continue or persist she may need to be seen sooner by APP.  Patient agreed and verbalized understanding.

## 2017-07-25 DIAGNOSIS — R011 Cardiac murmur, unspecified: Secondary | ICD-10-CM | POA: Insufficient documentation

## 2017-07-25 DIAGNOSIS — R2681 Unsteadiness on feet: Secondary | ICD-10-CM | POA: Insufficient documentation

## 2017-07-25 DIAGNOSIS — J45909 Unspecified asthma, uncomplicated: Secondary | ICD-10-CM | POA: Insufficient documentation

## 2017-07-29 ENCOUNTER — Other Ambulatory Visit: Payer: Self-pay | Admitting: Cardiovascular Disease

## 2017-08-05 ENCOUNTER — Ambulatory Visit: Payer: Medicare Other | Admitting: Cardiovascular Disease

## 2017-08-05 ENCOUNTER — Encounter: Payer: Self-pay | Admitting: Cardiovascular Disease

## 2017-08-05 VITALS — BP 138/78 | HR 64 | Ht 64.0 in | Wt 128.0 lb

## 2017-08-05 DIAGNOSIS — I480 Paroxysmal atrial fibrillation: Secondary | ICD-10-CM | POA: Diagnosis not present

## 2017-08-05 DIAGNOSIS — Z7901 Long term (current) use of anticoagulants: Secondary | ICD-10-CM

## 2017-08-05 DIAGNOSIS — I340 Nonrheumatic mitral (valve) insufficiency: Secondary | ICD-10-CM

## 2017-08-05 DIAGNOSIS — R002 Palpitations: Secondary | ICD-10-CM | POA: Diagnosis not present

## 2017-08-05 DIAGNOSIS — G4700 Insomnia, unspecified: Secondary | ICD-10-CM

## 2017-08-05 DIAGNOSIS — E782 Mixed hyperlipidemia: Secondary | ICD-10-CM | POA: Diagnosis not present

## 2017-08-05 DIAGNOSIS — R0683 Snoring: Secondary | ICD-10-CM | POA: Diagnosis not present

## 2017-08-05 MED ORDER — METOPROLOL SUCCINATE ER 25 MG PO TB24: 12.5000 mg | ORAL_TABLET | Freq: Every day | ORAL | 3 refills | Status: DC

## 2017-08-05 NOTE — Patient Instructions (Signed)
Medication Instructions:  START metoprolol succinate (Toprol XL) 12.5 mg (1/2 tablet) once at dinnertime  Testing/Procedures: Your physician has recommended that you have a sleep study. This test records several body functions during sleep, including: brain activity, eye movement, oxygen and carbon dioxide blood levels, heart rate and rhythm, breathing rate and rhythm, the flow of air through your mouth and nose, snoring, body muscle movements, and chest and belly movement. ---this must be pre-approved by insurance prior to scheduling  Follow-Up: Your physician wants you to follow-up in: 6 months with Dr. Tresa EndoKelly. You will receive a reminder letter in the mail two months in advance. If you don't receive a letter, please call our office to schedule the follow-up appointment.  Any Other Special Instructions Will Be Listed Below (If Applicable).     If you need a refill on your cardiac medications before your next appointment, please call your pharmacy.

## 2017-08-05 NOTE — Progress Notes (Signed)
Patient ID: Julie Kemp, female   DOB: Aug 15, 1940, 77 y.o.   MRN: [id number]    Primary M.D.: Dr. Janace Litten  HPI: Julie Kemp  is a 77 year old is a former patient of Dr. Rollene Fare who established care with me after his retirement.  She presents for an 11 month follow-up evaluation  Julie Kemp has a history of sick sinus syndrome with PAF and has been on chronic sotalol therapy. She also has a history of anemia and diverticular disease. She has been on chronic Coumadin anticoagulation therapy. An echo Doppler study in August 2014 showed mild left ventricular hypertrophy with ejection fraction of 55-60% without regional wall motion abnormalities. She did have mitral annular calcification with moderate mitral regurgitation directed centrally. She had mild-to-moderate LA dilatation and mild RA dilatation. In 2010 a nuclear perfusion study showed fairly normal perfusion. In May 2014 she underwent carotid studies which were essentially normal.  When I initially saw her in October 2015 she was noticing more  palpitations that would often last up to 30 seconds. At that time, I recommended that she titrate her sotalol from 120 mg in the morning and 80 mg at night to 120 mg twice a day. Laboratory demonstrated a normal magnesium level at 2.3. She had normal electrolytes and previously had normal thyroid function. Her hemoglobin was 12.4 hematocrit 37.4 She also has noted some shortness of breath.    She has a history of GERD and has been on chronic omeprazole 20 mg.  Recently she's noticed increasing heartburn symptoms.  There is mild shortness of breath with activity.  Her last stress test was 5-1/2 years ago.  She has seen Dr.Yan for imbalance issues and ultimately was referred to Dr. Dimas Millin in [place]. She was taken off her statin therapy by Dr. Dimas Millin due to concerns of hip osteopenia.  However, she is still on proton pump inhibition, which has not been altered and may affect calcium was  stasis.  A nuclear perfusion study in October 2015  was low risk and showed a small, mild intensity fixed anterior defect consistent with soft tissue attenuation without scar or ischemia.  She had normal wall motion and LV function.  Ejection fraction was 68%.    Last year, when she was on atorvastatin her total cholesterol was 177 and when I last saw her when she was off statin therapy cholesterol had increased to 274.  Her triglycerides have increased from 189 to  311.  LDL has increased from 96 to177.  She is on sotalol 120 mg twice a day.  She also is on iron supplementation. She had been back on Zetia but again, stopped taking Zetia. She is on fenofibrate and the counter fish oil capsules. We had given her samples of Vascepa, but she did not fill the prescription due to cost she continues to be on anticoagulation.   When I  saw her in 2017, she complained of intermittent chest discomfort associated with mild shortness of breath.  She deniesd any palpitations.  She underwent a nuclear perfusion study on April 20 117 which was normal.  Ejection fraction was greater than 65%.  When I last saw her February 2018, she was taking sotalol 120 mg in the morning and 80 mg at night and was on warfarin for anticoagulation.  She had issues with bradycardia and I recommended dose reduction to 80 mg twice a day of sotalol.  Over the past 11 months, she has felt well, but has experience occasional episodes of palpitations  at night over the last 2-3 months.  She apparently has only been sleeping 4-7 hours per night and has had frequent awakenings with nocturia 2.  Upon further questioning, she snores.  She denies any episodes of chest pain.  She had laboratory with her primary physician in December 2018.  She had normal renal function.  Potassium was 5.3.  She has been on Zetia and fenofibrate in addition to omega-3 fatty acid for mixed hyperlipidemia.  Total cholesterol was 188, triglycerides 100, HDL 56, LDL 118.   She presents for follow-up evaluation.  Past Surgical History:  Procedure Laterality Date  . ABDOMINAL HYSTERECTOMY    . Left femoral hernia repair    . lymph nodes     left groin lymph node biopsies for benign reasons  . RECTOCELE REPAIR      Allergies  Allergen Reactions  . Zithromax [Azithromycin] Nausea And Vomiting  . Amoxicillin Other (See Comments)    Can take Ceftin-per note  . Sulfa Antibiotics Nausea Only    Current Outpatient Medications  Medication Sig Dispense Refill  . Calcium Carbonate-Vit D-Min (CALCIUM 1200 PO) Take 1 tablet by mouth 2 (two) times daily.    . chlorhexidine (PERIDEX) 0.12 % solution Use as directed 5 mLs in the mouth or throat daily. Rinse as directed    . dimenhyDRINATE (DRAMAMINE) 50 MG tablet Take 150-300 mg by mouth daily.    . fenofibrate 160 MG tablet TAKE 1 TABLET BY MOUTH  DAILY 90 tablet 3  . FERREX 150 150 MG capsule TAKE 1 CAPSULE BY MOUTH EVERY DAY 90 capsule 1  . Multiple Vitamin (MULTIVITAMIN WITH MINERALS) TABS tablet Take 1 tablet by mouth daily.    . Omega-3 Fatty Acids (FISH OIL PO) Take 2 capsules by mouth 2 (two) times daily. Taking Nature Bounty 1200 mg    . pantoprazole (PROTONIX) 40 MG tablet TAKE 1 TABLET BY MOUTH  DAILY 90 tablet 2  . polyethylene glycol (MIRALAX / GLYCOLAX) packet Take 17 g by mouth daily.    . Probiotic Product (PROBIOTIC DAILY) CAPS Take 1 capsule by mouth every evening.    . sotalol (BETAPACE) 80 MG tablet TAKE 1 TABLET BY MOUTH TWO  TIMES DAILY 60 tablet 1  . temazepam (RESTORIL) 15 MG capsule Take 1 capsule (15 mg total) by mouth at bedtime as needed for sleep. 10 capsule 0  . warfarin (COUMADIN) 5 MG tablet Take 1 tablet by mouth daily or as directed by coumadin clinic (Patient taking differently: Take 2.5 mg by mouth daily at 6 PM. Take 1 tablet by mouth daily or as directed by coumadin clinic) 90 tablet 1  . ezetimibe (ZETIA) 10 MG tablet Take 1 tablet (10 mg total) by mouth daily. 90 tablet 3  .  metoprolol succinate (TOPROL XL) 25 MG 24 hr tablet Take 0.5 tablets (12.5 mg total) by mouth daily. 45 tablet 3   No current facility-administered medications for this visit.     Social history is notable in that she is married and has 3 children 4 grandchildren. He is no history of tobacco use. She has worked Education officer, environmental.  Family History  Problem Relation Age of Onset  . Heart failure Mother   . Heart failure Father   . Diabetes Father    ROS General: Negative; No fevers, chills, or night sweats;  HEENT: Negative; No changes in vision or hearing, sinus congestion, difficulty swallowing Pulmonary: Negative; No cough, wheezing, shortness of breath, hemoptysis Cardiovascular: Regular to occasional  nocturnal palpitations GI: Positive for GERD and diverticular disease; No nausea, vomiting, diarrhea, or abdominal pain GU: Negative; No dysuria, hematuria, or difficulty voiding Musculoskeletal: Positive for osteopenia and fractures of the vertebrae in her back Hematologic/Oncology: Negative; no easy bruising, bleeding Endocrine: Negative; no heat/cold intolerance; no diabetes Neuro: Negative; no changes in balance, headaches Skin: Negative; No rashes or skin lesions Psychiatric: Negative; No behavioral problems, depression Sleep: Positive for snoring, daytime sleepiness, hypersomnolence, no bruxism, restless legs, hypnogognic hallucinations, no cataplexy Other comprehensive 14 point system review is negative.   PE BP 138/78   Pulse 64   Ht 5' 4" (1.626 m)   Wt 128 lb (58.1 kg)   BMI 21.97 kg/m    Repeat blood pressure by me was 122/78  Wt Readings from Last 3 Encounters:  08/05/17 128 lb (58.1 kg)  09/09/16 120 lb (54.4 kg)  03/01/16 128 lb (58.1 kg)   General: Alert, oriented, no distress.  Skin: normal turgor, no rashes, warm and dry HEENT: Normocephalic, atraumatic. Pupils equal round and reactive to light; sclera anicteric; extraocular muscles intact; Nose  without nasal septal hypertrophy Mouth/Parynx benign; Mallinpatti scale 3 Neck: No JVD, no carotid bruits; normal carotid upstroke Lungs: clear to ausculatation and percussion; no wheezing or rales Chest wall: without tenderness to palpitation Heart: PMI not displaced, RRR, s1 s2 normal, 1-6/1 systolic murmur, no diastolic murmur, no rubs, gallops, thrills, or heaves Abdomen: soft, nontender; no hepatosplenomehaly, BS+; abdominal aorta nontender and not dilated by palpation. Back: no CVA tenderness Pulses 2+ Musculoskeletal: full range of motion, normal strength, no joint deformities Extremities: no clubbing cyanosis or edema, Homan's sign negative  Neurologic: grossly nonfocal; Cranial nerves grossly wnl Psychologic: Normal mood and affect   ECG (independently read by me): Normal sinus rhythm with mild sinus arrhythmia at 64 bpm.  Normal intervals.  No ST segment changes.  February 2018 ECG (independently read by me): Sinus bradycardia 52 bpm.  Normal intervals.  No ST segment changes.  August 2017 ECG (independently read by me): Sinus bradycardia at 49 bpm.  QTc interval 426 ms.  April 2017 ECG (independently read by me): Marked sinus bradycardia at 49 bpm.  QTc interval 437 ms.  04/25/2015 ECG (independently read by me):  Sinus bradycardia 53 bpm.  No ectopy.  QTc interval 431 ms.  May 2016ECG (independently read by me): Sinus bradycardia 56 bpm.  Normal intervals.  No ectopy.  December 2015 ECG (independently read by me): Sinus bradycardia 51 bpm with sinus arrhythmia and APC.  QTc interval 431 ms.  October 2015 ECG (independently read by me): Sinus bradycardia 55 beats per minute.  QTc interval 440 ms.  No ST segment changes.  December 2014 ECG: Normal sinus rhythm at 50. PR interval 176 ms, QTc interval 430 ms. No significant ST-T changes.  LABS: BMP Latest Ref Rng & Units 06/01/2016 02/04/2016 08/12/2015  Glucose 65 - 99 mg/dL 104(H) 95 86  BUN 7 - 25 mg/dL 27(H) 28(H) 27(H)    Creatinine 0.60 - 0.93 mg/dL 0.78 0.80 0.69  Sodium 135 - 146 mmol/L 140 139 140  Potassium 3.5 - 5.3 mmol/L 5.1 3.9 4.9  Chloride 98 - 110 mmol/L 105 107 104  CO2 20 - 31 mmol/L _0 Calcium 8.6 - 10.4 mg/dL 10.2 9.7 10.1   Hepatic Function Latest Ref Rng & Units 06/01/2016 02/04/2016 08/12/2015  Total Protein 6.1 - 8.1 g/dL 6.3 6.3(L) 6.2  Albumin 3.6 - 5.1 g/dL 4.3 3.7 4.0  AST 10 -  35 U/L _0 ALT 6 - 29 U/L _1 Alk Phosphatase 33 - 130 U/L 33 38 39  Total Bilirubin 0.2 - 1.2 mg/dL 0.4 0.3 0.4   CBC Latest Ref Rng & Units 02/04/2016 12/13/2014 05/13/2014  WBC 4.0 - 10.5 K/uL 5.0 5.4 5.3  Hemoglobin 12.0 - 15.0 g/dL 11.5(L) 13.1 12.7  Hematocrit 36.0 - 46.0 % 35.5(L) 39.5 37.9  Platelets 150 - 400 K/uL 267 274 250   Lab Results  Component Value Date   MCV 93.2 02/04/2016   MCV 90.4 12/13/2014   MCV 91.1 05/13/2014   Lab Results  Component Value Date   TSH 1.442 12/13/2014  No results found for: HGBA1C   Lipid Panel     Component Value Date/Time   CHOL 167 06/01/2016 0900   TRIG 107 06/01/2016 0900   HDL 54 06/01/2016 0900   CHOLHDL 3.1 06/01/2016 0900   VLDL 21 06/01/2016 0900   LDLCALC 92 06/01/2016 0900     RADIOLOGY: No results found.  IMPRESSION:  1. PAF (paroxysmal atrial fibrillation) ([place])   2. Long term current use of anticoagulant therapy   3. Mixed hyperlipidemia   4. Snoring   5. Frequent nocturnal awakening   6. Mitral valve insufficiency, unspecified etiology   7. Palpitations     ASSESSMENT AND PLAN: Julie Kemp is a 77 year-old female who has a history of sick sinus syndrome and paroxysmal atrial fibrillation.  .  Previously she had been on sotalol 120 mg of the morning and 80 mg at night, but due to bradycardia.  Her dose was reduced 80 mg twice a day.  She is on warfarin for anticoagulation and denies any bleeding.  Since I last saw her, she has experienced several episodes of nocturnal palpitations over the past 3 months.   Upon further questioning, she does not sleep for adequate sleep duration.  She often sleeps between 4 and 7 hours per night and has noticed some frequent awakenings.  With her PAF history, I have recommended she undergo an evaluation for obstructive sleep apnea.  She does not drive in the dark.  This will be scheduled after the switch to daylight savings time.  She also has a history of snoring.  I have recommended reduction of caffeine.  I'm adding low-dose metoprolol succinate 12.5 mg at bedtime to see if this can improve her nocturnal palpitations.  I reviewed the lab work by Dr. Unk Lightning.  When I last saw her  Zetia 10 mg was added to her to her fenofibrate and omega-3 fatty acid due to an elevated LDL.  Remotely she had been on statin therapy, but apparently this was stopped by Dr. Dimas Millin due to concerns of hip osteopenia.  I am not certain if the statin was related.  I will see her in 6 months for reevaluation   Time spent: 25 minutes Troy Sine, MD, [facility] 08/07/2017 8:20 AM

## 2017-08-07 ENCOUNTER — Encounter: Payer: Self-pay | Admitting: Cardiovascular Disease

## 2017-08-08 ENCOUNTER — Telehealth: Payer: Self-pay | Admitting: Cardiovascular Disease

## 2017-08-08 NOTE — Telephone Encounter (Signed)
Returned the call to the patient. She stated that she has not taken the metoprolol yet because she read that it can cause congestive heart failure, depression and nightmares. She has been educated on the chances that she would develop one of these side effects but she is too afraid to start it. Message routed to the provider for his recommendation.

## 2017-08-08 NOTE — Telephone Encounter (Signed)
She should be able to tolerate the very low dose of 12.5 mg

## 2017-08-08 NOTE — Telephone Encounter (Signed)
New message  Please call.    Pt c/o medication issue:  1. Name of Medication: metoprolol succinate (TOPROL XL) 25 MG 24 hr tablet  2. How are you currently taking this medication (dosage and times per day)? As prescribed  3. Are you having a reaction (difficulty breathing--STAT)? No  4. What is your medication issue?  Patient states she does not plan to take medication because of the side effects

## 2017-08-09 NOTE — Telephone Encounter (Signed)
Returned the call to the patient. She has agreed to try the Metoprolol and will call back if she experiences any unusual side effects.

## 2017-08-30 ENCOUNTER — Ambulatory Visit: Payer: Medicare Other | Admitting: Cardiovascular Disease

## 2017-09-01 ENCOUNTER — Other Ambulatory Visit: Payer: Self-pay | Admitting: Cardiovascular Disease

## 2017-09-01 NOTE — Telephone Encounter (Signed)
REFILL 

## 2017-09-05 ENCOUNTER — Telehealth: Payer: Self-pay | Admitting: Cardiovascular Disease

## 2017-09-05 DIAGNOSIS — J101 Influenza due to other identified influenza virus with other respiratory manifestations: Secondary | ICD-10-CM | POA: Insufficient documentation

## 2017-09-05 NOTE — Telephone Encounter (Signed)
Spoke with patient and she did test positive for flu. She is concerned about taking the Tamiflu. Discussed with Dr Tresa EndoKelly, continue current medications and take Tamiflu as prescribed by PCP. Advised patient, verbalized understanding

## 2017-09-05 NOTE — Telephone Encounter (Signed)
Spoke with patient and she is not able to check her blood pressure or heart rate at home. She does feel like her heart rate goes up with ambulating. She has always been able to tell when she goes in and out of Afib and feels like she is having Afib episodes. She is taking her Warfarin as prescribed and tolerating her Metoprolol that was recently started. Patient started with cough/congestion Friday and wanted to see Dr Tresa EndoKelly today so he could make sure she did not have the flu or pneumonia. Advised patient she needed to see PCP. Did follow up and she has appointment at PCP today at 2:00 and will call back with update after visit. Will forward to Dr Tresa EndoKelly for review

## 2017-09-05 NOTE — Telephone Encounter (Signed)
Follow up    Patient states she was returning call from nurse

## 2017-09-05 NOTE — Telephone Encounter (Signed)
Discussed with Juliette AlcideMelinda

## 2017-09-05 NOTE — Telephone Encounter (Signed)
New message  Patient c/o Palpitations:  High priority if patient c/o lightheadedness, shortness of breath, or chest pain  1) How long have you had palpitations/irregular HR/ Afib? Are you having the symptoms now? Says she has afib when laying down and when she tries to sit up  2) Are you currently experiencing lightheadedness, SOB or CP? Breathing really fast when she has afib  3) Do you have a history of afib (atrial fibrillation) or irregular heart rhythm? yes  4) Have you checked your BP or HR? (document readings if available): no  5) Are you experiencing any other symptoms? Pt says that she has a runny nose, bad cough, and body ache

## 2017-09-12 ENCOUNTER — Encounter (HOSPITAL_BASED_OUTPATIENT_CLINIC_OR_DEPARTMENT_OTHER): Payer: Medicare Other

## 2017-09-12 DIAGNOSIS — R0602 Shortness of breath: Secondary | ICD-10-CM | POA: Insufficient documentation

## 2017-09-30 DIAGNOSIS — R0989 Other specified symptoms and signs involving the circulatory and respiratory systems: Secondary | ICD-10-CM | POA: Insufficient documentation

## 2017-10-06 ENCOUNTER — Ambulatory Visit: Payer: Medicare Other | Admitting: Cardiovascular Disease

## 2017-11-04 ENCOUNTER — Telehealth: Payer: Self-pay | Admitting: Cardiovascular Disease

## 2017-11-04 NOTE — Telephone Encounter (Signed)
New Message   Patient is calling because she wants to know why Dr. Tresa EndoKelly ordered a sleep study. She also advises that she wants to purchase a device that she can check HR, calorie counter, Pulse rate and how many steps she takes. She said that she was advised that she had to discuss with her doctor for approval. Please call to discuss.

## 2017-11-04 NOTE — Telephone Encounter (Signed)
Returned the call to the patient. She was inquiring why she needed a sleep study. This was reviewed at her appointment in January. She has been advised as to why and has verbalized her understanding.   Per the office notes, With her PAF history, I have recommended she undergo an evaluation for obstructive sleep apnea.   She also stated that she was going to get a fit bit to monitor her steps and heart rate.

## 2017-11-06 ENCOUNTER — Telehealth: Payer: Self-pay | Admitting: Physician Assistant

## 2017-11-06 NOTE — Telephone Encounter (Signed)
Paged by the patient, she found a tablet of sotalol on the floor, she is not sure if she has taken the sotalol this morning or not. I instructed her to take the next dose of sotalol this afternoon as scheduled and do not double up on the medication  Ramond DialSigned, Ivor Kishi PA Pager: [phone]

## 2017-11-07 ENCOUNTER — Ambulatory Visit (HOSPITAL_BASED_OUTPATIENT_CLINIC_OR_DEPARTMENT_OTHER): Payer: Medicare Other | Attending: Cardiovascular Disease | Admitting: Cardiovascular Disease

## 2017-11-07 VITALS — Ht 64.0 in | Wt 117.0 lb

## 2017-11-07 DIAGNOSIS — R0902 Hypoxemia: Secondary | ICD-10-CM | POA: Diagnosis not present

## 2017-11-07 DIAGNOSIS — I493 Ventricular premature depolarization: Secondary | ICD-10-CM | POA: Diagnosis not present

## 2017-11-07 DIAGNOSIS — I340 Nonrheumatic mitral (valve) insufficiency: Secondary | ICD-10-CM

## 2017-11-07 DIAGNOSIS — I480 Paroxysmal atrial fibrillation: Secondary | ICD-10-CM | POA: Diagnosis not present

## 2017-11-07 DIAGNOSIS — G4733 Obstructive sleep apnea (adult) (pediatric): Secondary | ICD-10-CM | POA: Insufficient documentation

## 2017-11-07 DIAGNOSIS — R002 Palpitations: Secondary | ICD-10-CM | POA: Insufficient documentation

## 2017-11-07 DIAGNOSIS — R0683 Snoring: Secondary | ICD-10-CM | POA: Insufficient documentation

## 2017-11-07 DIAGNOSIS — G4700 Insomnia, unspecified: Secondary | ICD-10-CM

## 2017-11-27 ENCOUNTER — Encounter (HOSPITAL_BASED_OUTPATIENT_CLINIC_OR_DEPARTMENT_OTHER): Payer: Self-pay | Admitting: Cardiovascular Disease

## 2017-11-27 NOTE — Procedures (Signed)
Patient Name: Julie Kemp, Julie Kemp Date: 11/07/2017 Gender: Female D.O.B: 1941-06-02 Age (years): 58 Referring Provider: Nicki Guadalajara MD, ABSM Height (inches): 64 Interpreting Physician: Nicki Guadalajara MD, ABSM Weight (lbs): 117 RPSGT: Ulyess Mort BMI: 20 MRN: [id number] Neck Size: 11.50  CLINICAL INFORMATION Sleep Study Type: NPSG  Indication for sleep study: Excessive Daytime Sleepiness, Snoring  Epworth Sleepiness Score: 9  SLEEP STUDY TECHNIQUE As per the AASM Manual for the Scoring of Sleep and Associated Events v2.3 (April 2016) with a hypopnea requiring 4% desaturations.  The channels recorded and monitored were frontal, central and occipital EEG, electrooculogram (EOG), submentalis EMG (chin), nasal and oral airflow, thoracic and abdominal wall motion, anterior tibialis EMG, snore microphone, electrocardiogram, and pulse oximetry.  MEDICATIONS     Calcium Carbonate-Vit D-Min (CALCIUM 1200 PO)         chlorhexidine (PERIDEX) 0.12 % solution         dimenhyDRINATE (DRAMAMINE) 50 MG tablet         ezetimibe (ZETIA) 10 MG tablet (Expired)         fenofibrate 160 MG tablet         FERREX 150 150 MG capsule         metoprolol succinate (TOPROL XL) 25 MG 24 hr tablet         Multiple Vitamin (MULTIVITAMIN WITH MINERALS) TABS tablet         Omega-3 Fatty Acids (FISH OIL PO)         pantoprazole (PROTONIX) 40 MG tablet         polyethylene glycol (MIRALAX / GLYCOLAX) packet         Probiotic Product (PROBIOTIC DAILY) CAPS         sotalol (BETAPACE) 80 MG tablet         temazepam (RESTORIL) 15 MG capsule         warfarin (COUMADIN) 5 MG tablet      Medications self-administered by patient taken the night of the study : FISH OIL, CALICUM -CARBONATE-VIT D, FENOFIBRATE, IMMUNE BUILDER  SLEEP ARCHITECTURE The study was initiated at 11:03:03 PM and ended at 5:05:09 AM.  Sleep onset time was 13.6 minutes and the sleep efficiency was 76.6%%. The total sleep time was  277.5 minutes.  Stage REM latency was 114.5 minutes.  The patient spent 6.8%% of the night in stage N1 sleep, 73.3%% in stage N2 sleep, 0.0%% in stage N3 and 19.82% in REM.  Alpha intrusion was absent.  Supine sleep was 0.00%.  RESPIRATORY PARAMETERS The overall apnea/hypopnea index (AHI) was 8.0 per hour. The respiratory disturbance index (RDI) was 11.0/h. There were 3 total apneas, including 2 obstructive, 1 central and 0 mixed apneas. There were 34 hypopneas and 14 RERAs.  The AHI during Stage REM sleep was 24.0 per hour.  AHI while supine was N/A per hour.  The mean oxygen saturation was 94.0%. The minimum SpO2 during sleep was 80.0%.  Moderate snoring was noted during this study.  CARDIAC DATA The 2 lead EKG demonstrated sinus rhythm. The mean heart rate was 58.9 beats per minute. Other EKG findings include: PVCs.  LEG MOVEMENT DATA The total PLMS were 0 with a resulting PLMS index of 0.0. Associated arousal with leg movement index was 3.2 .  IMPRESSIONS - Mild obstructive sleep apnea overall (AHI 8.0/h; RDI 11.0/h); however, sleep apnea was moderate during REM sleep (AHI 24.0/h). - No significant central sleep apnea occurred during this study (CAI = 0.2/h). - Moderate oxygen desaturation to  a nadir of 80% during REM sleep. - The patient snored with moderate snoring volume. - EKG findings include PVCs. - Clinically significant periodic limb movements did not occur during sleep. No significant associated arousals.  DIAGNOSIS - Obstructive Sleep Apnea (327.23 [G47.33 ICD-10]) - Nocturnal Hypoxemia (327.26 [G47.36 ICD-10])  RECOMMENDATIONS - In this patient with significant cardiovascular co-morbidities, recommend therapeutic CPAP titration to determine optimal pressure required to alleviate sleep disordered breathing. - Efforts should be made to optimize nasal and oropharyngeal patency. - Avoid alcohol, sedatives and other CNS depressants that may worsen sleep apnea and  disrupt normal sleep architecture. - Sleep hygiene should be reviewed to assess factors that may improve sleep quality. - Regular exercise should be initiated or continued if appropriate.  [Electronically signed] 11/27/2017 02:05 PM  Nicki Guadalajara MD, [facility], ABSM Diplomate, American Board of Sleep Medicine   NPI: [id number] [facility] PH: [phone]   FX: [phone] ACCREDITED BY THE AMERICAN ACADEMY OF SLEEP MEDICINE

## 2017-11-28 ENCOUNTER — Telehealth: Payer: Self-pay | Admitting: *Deleted

## 2017-11-28 ENCOUNTER — Other Ambulatory Visit: Payer: Self-pay | Admitting: Cardiovascular Disease

## 2017-11-28 DIAGNOSIS — G4733 Obstructive sleep apnea (adult) (pediatric): Secondary | ICD-10-CM

## 2017-11-28 NOTE — Telephone Encounter (Signed)
Patient notified of sleep study results and recommendations. CPAP titration scheduled for 12/20/17.

## 2017-11-28 NOTE — Progress Notes (Signed)
11/28/17 patient notified of sleep study results and recommendations.

## 2017-11-28 NOTE — Telephone Encounter (Signed)
-----   Message from Lennette Bihari, MD sent at 11/27/2017  2:10 PM EDT ----- Julie Kemp, please notify pt of the results and arrange for CPAP titration study

## 2017-12-20 ENCOUNTER — Other Ambulatory Visit: Payer: Self-pay | Admitting: Cardiovascular Disease

## 2017-12-20 ENCOUNTER — Ambulatory Visit (HOSPITAL_BASED_OUTPATIENT_CLINIC_OR_DEPARTMENT_OTHER): Payer: Medicare Other | Attending: Cardiovascular Disease | Admitting: Cardiovascular Disease

## 2017-12-20 VITALS — Ht 64.0 in | Wt 117.0 lb

## 2017-12-20 DIAGNOSIS — R002 Palpitations: Secondary | ICD-10-CM | POA: Insufficient documentation

## 2017-12-20 DIAGNOSIS — G4733 Obstructive sleep apnea (adult) (pediatric): Secondary | ICD-10-CM

## 2017-12-20 DIAGNOSIS — I480 Paroxysmal atrial fibrillation: Secondary | ICD-10-CM | POA: Insufficient documentation

## 2017-12-20 DIAGNOSIS — I340 Nonrheumatic mitral (valve) insufficiency: Secondary | ICD-10-CM | POA: Diagnosis not present

## 2017-12-20 DIAGNOSIS — R0683 Snoring: Secondary | ICD-10-CM | POA: Diagnosis not present

## 2017-12-20 NOTE — Telephone Encounter (Signed)
Rx sent to pharmacy   

## 2017-12-23 ENCOUNTER — Encounter (HOSPITAL_BASED_OUTPATIENT_CLINIC_OR_DEPARTMENT_OTHER): Payer: Self-pay | Admitting: Cardiovascular Disease

## 2017-12-23 NOTE — Procedures (Signed)
Patient Name: Julie Kemp, Julie Kemp Date: 12/20/2017 Gender: Female D.O.B: 1940/09/29 Age (years): 76 Referring Provider: Nicki Guadalajara MD, ABSM Height (inches): 64 Interpreting Physician: Nicki Guadalajara MD, ABSM Weight (lbs): 117 RPSGT: Armen Pickup BMI: 20 MRN: [id number] Neck Size: 11.50  CLINICAL INFORMATION The patient is referred for a CPAP titration to treat sleep apnea.   Date of NPSG: 11/07/2017:  AHI 8/h; RDI 11/h: AHI during REM sleep 24/h; oxygen desaturation to 80%.  SLEEP STUDY TECHNIQUE As per the AASM Manual for the Scoring of Sleep and Associated Events v2.3 (April 2016) with a hypopnea requiring 4% desaturations.  The channels recorded and monitored were frontal, central and occipital EEG, electrooculogram (EOG), submentalis EMG (chin), nasal and oral airflow, thoracic and abdominal wall motion, anterior tibialis EMG, snore microphone, electrocardiogram, and pulse oximetry. Continuous positive airway pressure (CPAP) was initiated at the beginning of the study and titrated to treat sleep-disordered breathing.  MEDICATIONS Calcium Carbonate-Vit D-Min (CALCIUM 1200 PO)             chlorhexidine (PERIDEX) 0.12 % solution         dimenhyDRINATE (DRAMAMINE) 50 MG tablet         ezetimibe (ZETIA) 10 MG tablet (Expired)         fenofibrate 160 MG tablet         FERREX 150 150 MG capsule         metoprolol succinate (TOPROL XL) 25 MG 24 hr tablet         Multiple Vitamin (MULTIVITAMIN WITH MINERALS) TABS tablet         Omega-3 Fatty Acids (FISH OIL PO)         pantoprazole (PROTONIX) 40 MG tablet         polyethylene glycol (MIRALAX / GLYCOLAX) packet         Probiotic Product (PROBIOTIC DAILY) CAPS         sotalol (BETAPACE) 80 MG tablet         temazepam (RESTORIL) 15 MG capsule         warfarin (COUMADIN) 5 MG tablet      Medications self-administered by patient taken the night of the study : FISH OIL, CALICUM -CARBONATE-VIT D, FENOFIBRATE, IMMUNE  BUILDER  TECHNICIAN COMMENTS Comments added by technician: ONE RESTROOM VISTED. Patient had difficulty initiating sleep. Patient was restless all through the night. Comments added by scorer: N/A  RESPIRATORY PARAMETERS Optimal PAP Pressure (cm): 8 AHI at Optimal Pressure (/hr): 3.3 Overall Minimal O2 (%): 88.0 Supine % at Optimal Pressure (%): 100 Minimal O2 at Optimal Pressure (%): 88.0   SLEEP ARCHITECTURE The study was initiated at 10:37:57 PM and ended at 4:45:55 AM.  Sleep onset time was 18.8 minutes and the sleep efficiency was 70.7%%. The total sleep time was 260 minutes.  The patient spent 5.8%% of the night in stage N1 sleep, 56.9%% in stage N2 sleep, 10.8%% in stage N3 and 26.54% in REM.Stage REM latency was 50.0 minutes  Wake after sleep onset was 89.2. Alpha intrusion was absent. Supine sleep was 8.68%.  CARDIAC DATA  The 2 lead EKG demonstrated sinus rhythm. The mean heart rate was 58.0 beats per minute. Other EKG findings include: PVCs.  LEG MOVEMENT DATA The total Periodic Limb Movements of Sleep (PLMS) were 0. The PLMS index was 0.0. A PLMS index of <15 is considered normal in adults.  IMPRESSIONS - CPAP was initiated at 5 cm and was titrated to 8 cm of water.  At  8 cm water pressure AHI was 3.3; oxygen desaturation to 88%.  - Central sleep apnea was not noted during this titration (CAI = 0.0/h). - Mild oxygen desaturations to a nadir of 88%. - Snoring was eliminated with CPAP.  - 2-lead EKG demonstrated: PVCs - Clinically significant periodic limb movements were not noted during this study. Arousals associated with PLMs were rare.  DIAGNOSIS - Obstructive Sleep Apnea (327.23 [G47.33 ICD-10])  RECOMMENDATIONS - Recommend an initial trial of CPAP therapy at 9 cm cm H2O with heated humidification.  A X-Small size Resmed Full Face Mask Mirage Quattro mask was uswd for the titration. - Effort should be made to optimize nasal and oropharyngeal patency. - Avoid  alcohol, sedatives and other CNS depressants that may worsen sleep apnea and disrupt normal sleep architecture. - Sleep hygiene should be reviewed to assess factors that may improve sleep quality. - Regular exercise should be initiated or continued. - Recommend a download be obtained in 30 days and sleep clinic evaluation after 4 weeks of therapy.   [Electronically signed] 12/23/2017 06:28 PM  Nicki Guadalajara MD, [facility], ABSM Diplomate, American Board of Sleep Medicine   NPI: [id number] [facility] PH: [phone]   FX: [phone] ACCREDITED BY THE AMERICAN ACADEMY OF SLEEP MEDICINE

## 2017-12-26 ENCOUNTER — Other Ambulatory Visit: Payer: Self-pay | Admitting: Cardiovascular Disease

## 2017-12-26 NOTE — Telephone Encounter (Signed)
Rx sent to pharmacy   

## 2017-12-27 ENCOUNTER — Telehealth: Payer: Self-pay | Admitting: *Deleted

## 2017-12-27 NOTE — Telephone Encounter (Signed)
-----   Message from Lennette Biharihomas A Kelly, MD sent at 12/23/2017  6:37 PM EDT ----- Julie Kemp please notify patient and set up with [facility] for CPAP initiation.

## 2017-12-27 NOTE — Telephone Encounter (Signed)
Patient notified titration study complete. CPAP referral has been sent to [facility].

## 2017-12-30 ENCOUNTER — Telehealth: Payer: Self-pay | Admitting: Cardiovascular Disease

## 2017-12-30 NOTE — Telephone Encounter (Signed)
Would check orthostatic BP and P. Ok to add on to my schedule this week or next.

## 2017-12-30 NOTE — Telephone Encounter (Signed)
Scheduled on 01/12/18 @ 2pm per Mayme GentaHayley, RN Advised that patient check her BP and HR at home and bring readings to her visit

## 2017-12-30 NOTE — Telephone Encounter (Signed)
Spoke with patient of Dr. Tresa EndoKelly who reports dizziness for 1 week. She feels like her AF is acting up. She feels like she will pass out but has not. She states she feels like this when she stands up, sitting down she is OK. She has a BP cuff but has not checked her BP or HR - she states she cannot check it alone. She is supposed to go watch her grandson this afternoon and will take her cuff there so her 216 yo granddaughter can assist her with checking VS. Advised patient to call back with this info. Also suggested that she see someone soon - Dr. Tresa EndoKelly is booked next week and no current APP openings. Will route to MD for recommendations.

## 2017-12-30 NOTE — Telephone Encounter (Signed)
Patient returned call with VS  BP  150/81 HR  59  Reassured her that even though she feels her AF is acting up, her HR is controlled. Her BP is a little elevated compared to previous office visits. Suggested that patient try to check her BP and HR twice daily around the times she takes her sotalol over the weekend, since she has been symptomatic. She states she does not think she can check her BP b/c she does not think she can get the cuff tight enough but she will try. She would like to see MD next week.

## 2018-01-12 ENCOUNTER — Ambulatory Visit: Payer: Medicare Other | Admitting: Cardiovascular Disease

## 2018-01-12 ENCOUNTER — Encounter: Payer: Self-pay | Admitting: Cardiovascular Disease

## 2018-01-12 VITALS — BP 120/78 | HR 58 | Ht 63.0 in | Wt 122.6 lb

## 2018-01-12 DIAGNOSIS — R002 Palpitations: Secondary | ICD-10-CM | POA: Diagnosis not present

## 2018-01-12 DIAGNOSIS — Z7901 Long term (current) use of anticoagulants: Secondary | ICD-10-CM

## 2018-01-12 DIAGNOSIS — G4733 Obstructive sleep apnea (adult) (pediatric): Secondary | ICD-10-CM

## 2018-01-12 DIAGNOSIS — I480 Paroxysmal atrial fibrillation: Secondary | ICD-10-CM | POA: Diagnosis not present

## 2018-01-12 DIAGNOSIS — E782 Mixed hyperlipidemia: Secondary | ICD-10-CM | POA: Diagnosis not present

## 2018-01-12 MED ORDER — SOTALOL HCL 80 MG PO TABS: 80.0000 mg | ORAL_TABLET | Freq: Two times a day (BID) | ORAL | 0 refills | Status: DC

## 2018-01-12 NOTE — [place] Notes (Signed)
Patient ID: Julie Kemp, female   DOB: Jan 26, 1941, 77 y.o.   MRN: [id number]    Primary M.D.: Dr. Janace Litten  HPI: Julie Kemp  is a 77 year old is a former patient of Dr. Rollene Fare who established care with me after his retirement.  She presents for a 5 month follow-up evaluation  Julie Kemp has a history of sick sinus syndrome with PAF and has been on chronic sotalol therapy. She also has a history of anemia and diverticular disease. She has been on chronic Coumadin anticoagulation therapy. An echo Doppler study in August 2014 showed mild left ventricular hypertrophy with ejection fraction of 55-60% without regional wall motion abnormalities. She did have mitral annular calcification with moderate mitral regurgitation directed centrally. She had mild-to-moderate LA dilatation and mild RA dilatation. In 2010 a nuclear perfusion study showed fairly normal perfusion. In May 2014 she underwent carotid studies which were essentially normal.  When I initially saw her in October 2015 she was noticing more  palpitations that would often last up to 30 seconds. At that time, I recommended that she titrate her sotalol from 120 mg in the morning and 80 mg at night to 120 mg twice a day. Laboratory demonstrated a normal magnesium level at 2.3. She had normal electrolytes and previously had normal thyroid function. Her hemoglobin was 12.4 hematocrit 37.4 She also has noted some shortness of breath.    She has a history of GERD and has been on chronic omeprazole 20 mg.  Recently she's noticed increasing heartburn symptoms.  There is mild shortness of breath with activity.  Her last stress test was 5-1/2 years ago.  She has seen Dr.Yan for imbalance issues and ultimately was referred to Dr. Dimas Millin in [place]. She was taken off her statin therapy by Dr. Dimas Millin due to concerns of hip osteopenia.  However, she is still on proton pump inhibition, which has not been altered and may affect calcium was  stasis.  A nuclear perfusion study in October 2015  was low risk and showed a small, mild intensity fixed anterior defect consistent with soft tissue attenuation without scar or ischemia.  She had normal wall motion and LV function.  Ejection fraction was 68%.    Last year, when she was on atorvastatin her total cholesterol was 177 and when I last saw her when she was off statin therapy cholesterol had increased to 274.  Her triglycerides have increased from 189 to  311.  LDL has increased from 96 to177.  She is on sotalol 120 mg twice a day.  She also is on iron supplementation. She had been back on Zetia but again, stopped taking Zetia. She is on fenofibrate and the counter fish oil capsules. We had given her samples of Vascepa, but she did not fill the prescription due to cost she continues to be on anticoagulation.   When I  saw her in 2017, she complained of intermittent chest discomfort associated with mild shortness of breath.  She deniesd any palpitations.  She underwent a nuclear perfusion study on April 20 117 which was normal.  Ejection fraction was greater than 65%.  When I  saw her February 2018, she was taking sotalol 120 mg in the morning and 80 mg at night and was on warfarin for anticoagulation.  She had issues with bradycardia and I recommended dose reduction to 80 mg twice a day of sotalol.  Over the past 11 months, she has felt well, but has experience occasional episodes of palpitations  at night over the last 2-3 months.  She apparently has only been sleeping 4-7 hours per night and has had frequent awakenings with nocturia 2.  Upon further questioning, she snores.  She denies any episodes of chest pain.  She had laboratory with her primary physician in December 2018.  She had normal renal function.  Potassium was 5.3.  She has been on Zetia and fenofibrate in addition to omega-3 fatty acid for mixed hyperlipidemia.  Total cholesterol was 188, triglycerides 100, HDL 56, LDL 118.    Sine I last saw her in January 2019, with her PAF history concern for sleep apnea I recommended that she undergo a sleep study.  Her diagnostic polysomnogram on November 07, 2017 showed overall mild sleep apnea with an AHI of 8, and RDI of 11/h.  However sleep apnea was moderate with REM sleep with an AHI of 24/h and she had significant oxygen desaturation to a nadir of 80%.  She underwent CPAP titration Dec 20, 2017 and initial trial of CPAP therapy at 9 cm was recommended.  Her CPAP set up date was 2 days ago for the first 2 nights he has felt well with therapy.  It is too early to assess for compliance.  However over the past 2 nights she believes that she has slept better.    She states that she recently had mixed up on her sotalol scription when her dose had been reduced to 80 mg twice a day she put 80 mg pills with her 120 mg oral tablets.  He has been taking her blood pressure and pulse at home pulse rate has been in the 50s to 60s with blood pressure control.  She presents for reevaluation.  Past Surgical History:  Procedure Laterality Date  . ABDOMINAL HYSTERECTOMY    . Left femoral hernia repair    . lymph nodes     left groin lymph node biopsies for benign reasons  . RECTOCELE REPAIR      Allergies  Allergen Reactions  . Zithromax [Azithromycin] Nausea And Vomiting  . Amoxicillin Other (See Comments)    Can take Ceftin-per note  . Sulfa Antibiotics Nausea Only    Current Outpatient Medications  Medication Sig Dispense Refill  . Calcium Carbonate-Vit D-Min (CALCIUM 1200 PO) Take 1 tablet by mouth 2 (two) times daily.    . chlorhexidine (PERIDEX) 0.12 % solution Use as directed 5 mLs in the mouth or throat daily. Rinse as directed    . ezetimibe (ZETIA) 10 MG tablet Take 1 tablet (10 mg total) by mouth daily. 90 tablet 3  . fenofibrate 160 MG tablet TAKE 1 TABLET BY MOUTH  DAILY 60 tablet 0  . FERREX 150 150 MG capsule TAKE 1 CAPSULE BY MOUTH EVERY DAY 90 capsule 0  . metoprolol  succinate (TOPROL XL) 25 MG 24 hr tablet Take 0.5 tablets (12.5 mg total) by mouth daily. 45 tablet 3  . Multiple Vitamin (MULTIVITAMIN WITH MINERALS) TABS tablet Take 1 tablet by mouth daily.    . Omega-3 Fatty Acids (FISH OIL PO) Take 2 capsules by mouth 2 (two) times daily. Taking Nature Bounty 1200 mg    . pantoprazole (PROTONIX) 40 MG tablet TAKE 1 TABLET BY MOUTH  DAILY 90 tablet 1  . polyethylene glycol (MIRALAX / GLYCOLAX) packet Take 17 g by mouth daily.    . Probiotic Product (PROBIOTIC DAILY) CAPS Take 1 capsule by mouth every evening.    . sotalol (BETAPACE) 80 MG tablet Take 1 tablet (  80 mg total) by mouth 2 (two) times daily. 28 tablet 0  . warfarin (COUMADIN) 5 MG tablet Take 1 tablet by mouth daily or as directed by coumadin clinic (Patient taking differently: Take 2.5 mg by mouth daily at 6 PM. Take 1 tablet by mouth daily or as directed by coumadin clinic) 90 tablet 1   No current facility-administered medications for this visit.     Social history is notable in that she is married and has 3 children 4 grandchildren. He is no history of tobacco use. She has worked Education officer, environmental.  Family History  Problem Relation Age of Onset  . Heart failure Mother   . Heart failure Father   . Diabetes Father    ROS General: Negative; No fevers, chills, or night sweats;  HEENT: Negative; No changes in vision or hearing, sinus congestion, difficulty swallowing Pulmonary: Negative; No cough, wheezing, shortness of breath, hemoptysis Cardiovascular: Regular to occasional nocturnal palpitations GI: Positive for GERD and diverticular disease; No nausea, vomiting, diarrhea, or abdominal pain GU: Negative; No dysuria, hematuria, or difficulty voiding Musculoskeletal: Positive for osteopenia and fractures of the vertebrae in her back Hematologic/Oncology: Negative; no easy bruising, bleeding Endocrine: Negative; no heat/cold intolerance; no diabetes Neuro: Positive for balance  issues.  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   PE BP 120/78   Pulse (!) 58   Ht 5' 3" (1.6 m)   Wt 122 lb 9.6 oz (55.6 kg)   SpO2 92%   BMI 21.72 kg/m    Repeat blood pressure by me was 122/78  Wt Readings from Last 3 Encounters:  01/12/18 122 lb 9.6 oz (55.6 kg)  12/20/17 117 lb (53.1 kg)  11/07/17 117 lb (53.1 kg)    General: Alert, oriented, no distress.  Skin: normal turgor, no rashes, warm and dry HEENT: Normocephalic, atraumatic. Pupils equal round and reactive to light; sclera anicteric; extraocular muscles intact;  Nose without nasal septal hypertrophy Mouth/Parynx benign; Mallinpatti scale 3 Neck: No JVD, no carotid bruits; normal carotid upstroke Lungs: clear to ausculatation and percussion; no wheezing or rales Chest wall: without tenderness to palpitation Heart: PMI not displaced, RRR, s1 s2 normal, 1-6/1 systolic murmur, no diastolic murmur, no rubs, gallops, thrills, or heaves Abdomen: soft, nontender; no hepatosplenomehaly, BS+; abdominal aorta nontender and not dilated by palpation. Back: no CVA tenderness Pulses 2+ Musculoskeletal: full range of motion, normal strength, no joint deformities Extremities: no clubbing cyanosis or edema, Homan's sign negative  Neurologic: grossly nonfocal; Cranial nerves grossly wnl Psychologic: Normal mood and affect   ECG (independently read by me): Sinus Bradycardia 58 bpm.  QTc interval 414 ms.  No ST segment changes.  No ectopy.  January 2019 ECG (independently read by me): Normal sinus rhythm with mild sinus arrhythmia at 64 bpm.  Normal intervals.  No ST segment changes.  February 2018 ECG (independently read by me): Sinus bradycardia 52 bpm.  Normal intervals.  No ST segment  changes.  August 2017 ECG (independently read by me): Sinus bradycardia at 49 bpm.  QTc interval 426 ms.  April 2017 ECG (independently read by me): Marked sinus bradycardia at 49 bpm.  QTc interval 437 ms.  04/25/2015 ECG (independently read by me):  Sinus bradycardia 53 bpm.  No ectopy.  QTc interval  431 ms.  May 2016ECG (independently read by me): Sinus bradycardia 56 bpm.  Normal intervals.  No ectopy.  December 2015 ECG (independently read by me): Sinus bradycardia 51 bpm with sinus arrhythmia and APC.  QTc interval 431 ms.  October 2015 ECG (independently read by me): Sinus bradycardia 55 beats per minute.  QTc interval 440 ms.  No ST segment changes.  December 2014 ECG: Normal sinus rhythm at 50. PR interval 176 ms, QTc interval 430 ms. No significant ST-T changes.  LABS: BMP Latest Ref Rng & Units 06/01/2016 02/04/2016 08/12/2015  Glucose 65 - 99 mg/dL 104(H) 95 86  BUN 7 - 25 mg/dL 27(H) 28(H) 27(H)  Creatinine 0.60 - 0.93 mg/dL 0.78 0.80 0.69  Sodium 135 - 146 mmol/L 140 139 140  Potassium 3.5 - 5.3 mmol/L 5.1 3.9 4.9  Chloride 98 - 110 mmol/L 105 107 104  CO2 20 - 31 mmol/L _0 Calcium 8.6 - 10.4 mg/dL 10.2 9.7 10.1   Hepatic Function Latest Ref Rng & Units 06/01/2016 02/04/2016 08/12/2015  Total Protein 6.1 - 8.1 g/dL 6.3 6.3(L) 6.2  Albumin 3.6 - 5.1 g/dL 4.3 3.7 4.0  AST 10 - 35 U/L _1 ALT 6 - 29 U/L _2 Alk Phosphatase 33 - 130 U/L 33 38 39  Total Bilirubin 0.2 - 1.2 mg/dL 0.4 0.3 0.4   CBC Latest Ref Rng & Units 02/04/2016 12/13/2014 05/13/2014  WBC 4.0 - 10.5 K/uL 5.0 5.4 5.3  Hemoglobin 12.0 - 15.0 g/dL 11.5(L) 13.1 12.7  Hematocrit 36.0 - 46.0 % 35.5(L) 39.5 37.9  Platelets 150 - 400 K/uL 267 274 250   Lab Results  Component Value Date   MCV 93.2 02/04/2016   MCV 90.4 12/13/2014   MCV 91.1 05/13/2014   Lab Results  Component Value Date   TSH 1.442 12/13/2014  No results found for: HGBA1C   Lipid Panel     Component Value Date/Time    CHOL 167 06/01/2016 0900   TRIG 107 06/01/2016 0900   HDL 54 06/01/2016 0900   CHOLHDL 3.1 06/01/2016 0900   VLDL 21 06/01/2016 0900   LDLCALC 92 06/01/2016 0900     RADIOLOGY: No results found.  IMPRESSION:  1. Obstructive sleep apnea (adult) (pediatric)   2. PAF (paroxysmal atrial fibrillation) ([place])   3. Long term current use of anticoagulant therapy   4. Mixed hyperlipidemia   5. Palpitations     ASSESSMENT AND PLAN: Ms. Linden is a 77 year-old female who has a history of sick sinus syndrome and paroxysmal atrial fibrillation.  .  Previously she had been on sotalol 120 mg of the morning and 80 mg at night, but due to bradycardia her dose was reduced 80 mg twice a day.  She recently mixed up her medications at home such that was taking some 120 mg instead of the 80 mg.  However, her ECG today shows sinus bradycardia she is tolerating.  QTc interval is not prolonged.  I have recommended she resume the 80 mg twice daily regimen.  Her blood pressure today is stable without orthostatic change on her medical regimen including Toprol-XL 12.5 mg daily with her sotalol.  She has not had any further atrial fibrillation and is on warfarin for anticoagulation.  I reviewed her sleep study with her in detail.  She was found to have mild overall sleep apnea but moderate during REM sleep.  CPAP was initiated 2 days ago.  Obtained a download  over these 2 days which showed compliance and there was no significant mask leak.  A formal download will be obtained at least after 30 days and per Medicare guidelines I will need to see her for face-to-face evaluation.  She continues to be on Zetia for hyperlipidemia.  Her GERD is controlled with pantoprazole.  In the past she was on statin therapy but was taken off this due to concerns that this may be contributing to osteopenia remotely, I also had her scribe for Cipro which she did not fill secondary to cost.  I will see her in 3 months for reevaluation.  She is  on warfarin for anticoagulation and denies any bleeding.  Since I last saw her, she has experienced several episodes of nocturnal palpitations over the past 3 months.  Upon further questioning, she does not sleep for adequate sleep duration.  She often sleeps between 4 and 7 hours per night and has noticed some frequent awakenings.  With her PAF history, I have recommended she undergo an evaluation for obstructive sleep apnea.  She does not drive in the dark.  This will be scheduled after the switch to daylight savings time.  She also has a history of snoring.  I have recommended reduction of caffeine.  I'm adding low-dose metoprolol succinate 12.5 mg at bedtime to see if this can improve her nocturnal palpitations.  I reviewed the lab work by Dr. Unk Lightning.  When I last saw her  Zetia 10 mg was added to her to her fenofibrate and omega-3 fatty acid due to an elevated LDL.  Remotely she had been on statin therapy, but apparently this was stopped by Dr. Dimas Millin due to concerns of hip osteopenia.  I am not certain if the statin was related.  I will see her in 6 months for reevaluation   Time spent: 25 minutes Troy Sine, MD, [facility] 01/14/2018 9:31 AM

## 2018-01-12 NOTE — Patient Instructions (Signed)
Medication Instructions: Your physician recommends that you continue on your current medications as directed. Please refer to the Current Medication list given to you today.  If you need a refill on your cardiac medications before your next appointment, please call your pharmacy.    Follow-Up: Your physician wants you to follow-up in 2 months for  with Dr. Tresa EndoKelly.    Thank you for choosing [facility]!!

## 2018-01-14 ENCOUNTER — Encounter: Payer: Self-pay | Admitting: Cardiovascular Disease

## 2018-03-03 ENCOUNTER — Other Ambulatory Visit: Payer: Self-pay | Admitting: Cardiovascular Disease

## 2018-03-16 ENCOUNTER — Other Ambulatory Visit: Payer: Self-pay | Admitting: Cardiovascular Disease

## 2018-03-17 ENCOUNTER — Telehealth: Payer: Self-pay | Admitting: Cardiovascular Disease

## 2018-03-17 NOTE — Telephone Encounter (Signed)
Error ° °Message not needed °

## 2018-03-22 ENCOUNTER — Ambulatory Visit: Payer: Medicare Other | Admitting: Cardiovascular Disease

## 2018-03-22 ENCOUNTER — Encounter: Payer: Self-pay | Admitting: Cardiovascular Disease

## 2018-03-22 VITALS — BP 142/78 | HR 55 | Ht 63.0 in | Wt 121.4 lb

## 2018-03-22 DIAGNOSIS — M549 Dorsalgia, unspecified: Secondary | ICD-10-CM

## 2018-03-22 DIAGNOSIS — I480 Paroxysmal atrial fibrillation: Secondary | ICD-10-CM | POA: Diagnosis not present

## 2018-03-22 DIAGNOSIS — E782 Mixed hyperlipidemia: Secondary | ICD-10-CM

## 2018-03-22 DIAGNOSIS — I340 Nonrheumatic mitral (valve) insufficiency: Secondary | ICD-10-CM

## 2018-03-22 DIAGNOSIS — G4733 Obstructive sleep apnea (adult) (pediatric): Secondary | ICD-10-CM

## 2018-03-22 DIAGNOSIS — Z7901 Long term (current) use of anticoagulants: Secondary | ICD-10-CM | POA: Diagnosis not present

## 2018-03-22 NOTE — Progress Notes (Signed)
Patient ID: Julie Kemp, female   DOB: [birth year], 77 y.o.   MRN: [id number]    Primary M.D.: Dr. Janace Litten  HPI: Julie Kemp  is a 77 year old is a former patient of Dr. Rollene Fare who established care with me after his retirement.  She presents for a 2 month follow-up evaluation  Julie Kemp has a history of sick sinus syndrome with PAF and has been on chronic sotalol therapy. She also has a history of anemia and diverticular disease. She has been on chronic Coumadin anticoagulation therapy. An echo Doppler study in August 2014 showed mild left ventricular hypertrophy with ejection fraction of 55-60% without regional wall motion abnormalities. She did have mitral annular calcification with moderate mitral regurgitation directed centrally. She had mild-to-moderate LA dilatation and mild RA dilatation. In 2010 a nuclear perfusion study showed fairly normal perfusion. In May 2014 she underwent carotid studies which were essentially normal.  When I initially saw her in October 2015 she was noticing more  palpitations that would often last up to 30 seconds. At that time, I recommended that she titrate her sotalol from 120 mg in the morning and 80 mg at night to 120 mg twice a day. Laboratory demonstrated a normal magnesium level at 2.3. She had normal electrolytes and previously had normal thyroid function. Her hemoglobin was 12.4 hematocrit 37.4 She also has noted some shortness of breath.    She has a history of GERD and has been on chronic omeprazole 20 mg.  Recently she's noticed increasing heartburn symptoms.  There is mild shortness of breath with activity.  Her last stress test was 5-1/2 years ago.  She has seen Dr.Yan for imbalance issues and ultimately was referred to Dr. Dimas Millin in [place]. She was taken off her statin therapy by Dr. Dimas Millin due to concerns of hip osteopenia.  However, she is still on proton pump inhibition, which has not been altered and may affect calcium was  stasis.  A nuclear perfusion study in October 2015  was low risk and showed a small, mild intensity fixed anterior defect consistent with soft tissue attenuation without scar or ischemia.  She had normal wall motion and LV function.  Ejection fraction was 68%.    Last year, when she was on atorvastatin her total cholesterol was 177 and when I last saw her when she was off statin therapy cholesterol had increased to 274.  Her triglycerides have increased from 189 to  311.  LDL has increased from 96 to177.  She is on sotalol 120 mg twice a day.  She also is on iron supplementation. She had been back on Zetia but again, stopped taking Zetia. She is on fenofibrate and the counter fish oil capsules. We had given her samples of Vascepa, but she did not fill the prescription due to cost she continues to be on anticoagulation.   When I  saw her in 2017, she complained of intermittent chest discomfort associated with mild shortness of breath.  She deniesd any palpitations.  She underwent a nuclear perfusion study on April 20 117 which was normal.  Ejection fraction was greater than 65%.  When I  saw her February 2018, she was taking sotalol 120 mg in the morning and 80 mg at night and was on warfarin for anticoagulation.  She had issues with bradycardia and I recommended dose reduction to 80 mg twice a day of sotalol.  Over the past 11 months, she has felt well, but has experience occasional episodes of palpitations  at night over the last 2-3 months.  She apparently has only been sleeping 4-7 hours per night and has had frequent awakenings with nocturia 2.  Upon further questioning, she snores.  She denies any episodes of chest pain.  She had laboratory with her primary physician in December 2018.  She had normal renal function.  Potassium was 5.3.  She has been on Zetia and fenofibrate in addition to omega-3 fatty acid for mixed hyperlipidemia.  Total cholesterol was 188, triglycerides 100, HDL 56, LDL 118.    Sine I last saw her in January 2019, with her PAF history concern for sleep apnea I recommended that she undergo a sleep study.  Her diagnostic polysomnogram on November 07, 2017 showed overall mild sleep apnea with an AHI of 8, and RDI of 11/h.  However sleep apnea was moderate with REM sleep with an AHI of 24/h and she had significant oxygen desaturation to a nadir of 80%.  She underwent CPAP titration Dec 20, 2017 and initial trial of CPAP therapy at 9 cm was recommended.  Her CPAP set up date was 2 days ago for the first 2 nights he has felt well with therapy.  It is too early to assess for compliance.  However over the past 2 nights she believes that she has slept better.    When I ast saw her in June 2019 she had mixed up her sotalol prescription when her dose had been reduced to 80 mg twice a day she had inadvertently put 80 mg pills with her 120 mg oral tablets.  Her blood pressure was stable but her pulse was in the low 50s.  At that evaluation, I recommended reinstitution of 80 mg twice a day.  He had not had any further atrial fibrillation.  I had reviewed her sleep study in detail and she had just begun CPAP therapy.  Over the past 4 months she has used CPAP.  A download was obtained in the office today she met compliance standards on a download from January 25, 2018 through February 23, 2018 which showed 90% of usage days.  Average usage was 7 hours and 5 minutes.  AHI was slightly increased at 5.3 at 9 cm water pressure.  However, recently her compliance has decreased due to pain and difficulty with her full facemask causing pressure on a screw which is in her left lower gum region.  As result she has not used treatment consistently because of this discomfort.  She has had some gait difficulty and she was told by Dr. Krista Blue of some brain issues from her back of the brain.  At time she admits to low back discomfort.  She presents for reevaluation.  Past Surgical History:  Procedure Laterality Date  .  ABDOMINAL HYSTERECTOMY    . Left femoral hernia repair    . lymph nodes     left groin lymph node biopsies for benign reasons  . RECTOCELE REPAIR      Allergies  Allergen Reactions  . Zithromax [Azithromycin] Nausea And Vomiting  . Amoxicillin Other (See Comments)    Can take Ceftin-per note  . Sulfa Antibiotics Nausea Only    Current Outpatient Medications  Medication Sig Dispense Refill  . Calcium Carbonate-Vit D-Min (CALCIUM 1200 PO) Take 1 tablet by mouth 2 (two) times daily.    Marland Kitchen ELDERBERRY PO Take 1 capsule by mouth daily.    Marland Kitchen ezetimibe (ZETIA) 10 MG tablet Take 10 mg by mouth daily.    . fenofibrate  160 MG tablet TAKE 1 TABLET BY MOUTH  DAILY 60 tablet 0  . FERREX 150 150 MG capsule TAKE 1 CAPSULE BY MOUTH EVERY DAY 90 capsule 0  . FIBER PO Use as directed.    Marland Kitchen MAGNESIUM PO Take 1 capsule by mouth daily.    . metoprolol succinate (TOPROL XL) 25 MG 24 hr tablet Take 0.5 tablets (12.5 mg total) by mouth daily. 45 tablet 3  . Multiple Vitamin (MULTIVITAMIN WITH MINERALS) TABS tablet Take 1 tablet by mouth daily.    . Omega-3 Fatty Acids (FISH OIL PO) Take 2 capsules by mouth 2 (two) times daily. Taking Nature Bounty 1200 mg    . OVER THE COUNTER MEDICATION Take 1 capsule by mouth daily. EPICOR    . pantoprazole (PROTONIX) 40 MG tablet TAKE 1 TABLET BY MOUTH  DAILY 90 tablet 2  . polyethylene glycol (MIRALAX / GLYCOLAX) packet Take 17 g by mouth daily.    . Probiotic Product (PROBIOTIC DAILY) CAPS Take 1 capsule by mouth every evening.    . sotalol (BETAPACE) 80 MG tablet Take 1 tablet (80 mg total) by mouth 2 (two) times daily. 28 tablet 0  . TRIAZOLAM PO Take 1 tablet by mouth at bedtime as needed (sleep).    . TURMERIC PO Take 1 tablet by mouth daily.    Marland Kitchen warfarin (COUMADIN) 5 MG tablet Take 1 tablet by mouth daily or as directed by coumadin clinic (Patient taking differently: Take 2.5 mg by mouth daily at 6 PM. Take 1 tablet by mouth daily or as directed by coumadin  clinic) 90 tablet 1   No current facility-administered medications for this visit.     Social history is notable in that she is married and has 3 children 4 grandchildren. He is no history of tobacco use. She has worked Education officer, environmental.  Family History  Problem Relation Age of Onset  . Heart failure Mother   . Heart failure Father   . Diabetes Father    ROS General: Negative; No fevers, chills, or night sweats;  HEENT: Negative; No changes in vision or hearing, sinus congestion, difficulty swallowing Pulmonary: Negative; No cough, wheezing, shortness of breath, hemoptysis Cardiovascular: Regular to occasional nocturnal palpitations GI: Positive for GERD and diverticular disease; No nausea, vomiting, diarrhea, or abdominal pain GU: Negative; No dysuria, hematuria, or difficulty voiding Musculoskeletal: Positive for osteopenia and fractures of the vertebrae in her back Hematologic/Oncology: Negative; no easy bruising, bleeding Endocrine: Negative; no heat/cold intolerance; no diabetes Neuro: Positive for balance issues.  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  There is no balance issues. Skin: Negative; No rashes or skin lesions Psychiatric: Negative; No behavioral problems, depression Sleep: Positive for OSA, CPAP set up date January 09, 2018  Other comprehensive 14 point system review is negative.   PE BP (!) 142/78   Pulse (!) 55   Ht '5\' 3"'$  (1.6 m)   Wt 121 lb 6.4 oz (55.1 kg)   BMI 21.51 kg/m    Repeat blood pressure by me was 122/78  Wt Readings from Last 3 Encounters:  03/22/18 121 lb 6.4 oz (55.1 kg)  01/12/18 122 lb 9.6 oz (55.6 kg)  12/20/17 117 lb (53.1 kg)     Physical Exam BP (!) 142/78   Pulse (!) 55   Ht '5\' 3"'$  (1.6 m)   Wt 121 lb 6.4 oz (55.1 kg)   BMI 21.51 kg/m  General: Alert, oriented, no distress.  Skin: normal  turgor, no rashes, warm and dry HEENT: Normocephalic, atraumatic. Pupils equal round and reactive  to light; sclera anicteric; extraocular muscles intact; Fundi ** Nose without nasal septal hypertrophy Mouth/Parynx benign; Mallinpatti scale Neck: No JVD, no carotid bruits; normal carotid upstroke Lungs: clear to ausculatation and percussion; no wheezing or rales Chest wall: without tenderness to palpitation Heart: PMI not displaced, RRR, s1 s2 normal, 1-6/1 systolic murmur, no diastolic murmur, no rubs, gallops, thrills, or heaves Abdomen: soft, nontender; no hepatosplenomehaly, BS+; abdominal aorta nontender and not dilated by palpation. Back: no CVA tenderness Pulses 2+ Musculoskeletal: full range of motion, normal strength, no joint deformities Extremities: no clubbing cyanosis or edema, Homan's sign negative  Neurologic: grossly nonfocal; Cranial nerves grossly wnl Psychologic: Normal mood and affect  ECG (independently read by me): Sinus bradycardia 55 bpm, PAC.  Normal intervals  June 2019 ECG (independently read by me): Sinus Bradycardia 58 bpm.  QTc interval 414 ms.  No ST segment changes.  No ectopy.  January 2019 ECG (independently read by me): Normal sinus rhythm with mild sinus arrhythmia at 64 bpm.  Normal intervals.  No ST segment changes.  February 2018 ECG (independently read by me): Sinus bradycardia 52 bpm.  Normal intervals.  No ST segment changes.  August 2017 ECG (independently read by me): Sinus bradycardia at 49 bpm.  QTc interval 426 ms.  April 2017 ECG (independently read by me): Marked sinus bradycardia at 49 bpm.  QTc interval 437 ms.  04/25/2015 ECG (independently read by me):  Sinus bradycardia 53 bpm.  No ectopy.  QTc interval 431 ms.  May 2016ECG (independently read by me): Sinus bradycardia 56 bpm.  Normal intervals.  No ectopy.  December 2015 ECG (independently read by me): Sinus bradycardia 51 bpm with sinus arrhythmia and APC.  QTc interval 431 ms.  October 2015 ECG (independently read by me): Sinus bradycardia 55 beats per minute.  QTc interval  440 ms.  No ST segment changes.  December 2014 ECG: Normal sinus rhythm at 50. PR interval 176 ms, QTc interval 430 ms. No significant ST-T changes.  LABS: BMP Latest Ref Rng & Units 06/01/2016 02/04/2016 08/12/2015  Glucose 65 - 99 mg/dL 104(H) 95 86  BUN 7 - 25 mg/dL 27(H) 28(H) 27(H)  Creatinine 0.60 - 0.93 mg/dL 0.78 0.80 0.69  Sodium 135 - 146 mmol/L 140 139 140  Potassium 3.5 - 5.3 mmol/L 5.1 3.9 4.9  Chloride 98 - 110 mmol/L 105 107 104  CO2 20 - 31 mmol/L '26 26 27  '$ Calcium 8.6 - 10.4 mg/dL 10.2 9.7 10.1   Hepatic Function Latest Ref Rng & Units 06/01/2016 02/04/2016 08/12/2015  Total Protein 6.1 - 8.1 g/dL 6.3 6.3(L) 6.2  Albumin 3.6 - 5.1 g/dL 4.3 3.7 4.0  AST 10 - 35 U/L '26 22 22  '$ ALT 6 - 29 U/L '22 21 23  '$ Alk Phosphatase 33 - 130 U/L 33 38 39  Total Bilirubin 0.2 - 1.2 mg/dL 0.4 0.3 0.4   CBC Latest Ref Rng & Units 02/04/2016 12/13/2014 05/13/2014  WBC 4.0 - 10.5 K/uL 5.0 5.4 5.3  Hemoglobin 12.0 - 15.0 g/dL 11.5(L) 13.1 12.7  Hematocrit 36.0 - 46.0 % 35.5(L) 39.5 37.9  Platelets 150 - 400 K/uL 267 274 250   Lab Results  Component Value Date   MCV 93.2 02/04/2016   MCV 90.4 12/13/2014   MCV 91.1 05/13/2014   Lab Results  Component Value Date   TSH 1.442 12/13/2014  No results found for: HGBA1C  Lipid Panel     Component Value Date/Time   CHOL 167 06/01/2016 0900   TRIG 107 06/01/2016 0900   HDL 54 06/01/2016 0900   CHOLHDL 3.1 06/01/2016 0900   VLDL 21 06/01/2016 0900   LDLCALC 92 06/01/2016 0900     RADIOLOGY: No results found.  IMPRESSION:  1. PAF (paroxysmal atrial fibrillation) ([place])   2. Long term current use of anticoagulant therapy   3. Obstructive sleep apnea (adult) (pediatric)   4. Mixed hyperlipidemia   5. Mitral valve insufficiency, unspecified etiology   6. Discomfort of back     ASSESSMENT AND PLAN: Ms. Gossett is a 77 year-old female who has a history of sick sinus syndrome and paroxysmal atrial fibrillation.  Mostly she had been on  sotalol 120 to morning 80 in the evening but due to bradycardia she has been doing well on her current regimen of 80 mg twice a day.  Her ECG today continues to show sinus bradycardia at 55 bpm with an isolated PAC.  She continues to be on low-dose metoprolol at 12.5 mg daily.  She has not had any further atrial fibrillation and continues to be on warfarin for anticoagulation.  There is no bleeding.  She has a history of hyperlipidemia and is on combination Zetia and fenofibrate daily.  She is tolerating this well.  She is now been on CPAP therapy since her set up date of January 09, 2018.  [facility] is her DME company.  I reviewed her download.  She has met compliance standards on the download from July 3 through February 23, 2018.  However, AHI was increased at 5.3.  I have recommended titration of her CPAP to 11 cm water pressure.  I also discussed with her that she may not need a full facemask since the facemask is causing pressure on the screws in her lower jaw gum region.  She may benefit from a nasal tumor style mask but may need a chin guard in the event of oral mouth breathing.  We will try to schedule her for a new mask.  She was concerned about her back discomfort.  With her on warfarin I have suggested that she not take nonsteroidal anti-inflammatory medicine but perhaps her back pain may have analgesic benefit from acetaminophen.  She has had issues with intermittent balance and was told of having cerebellar normality contributing to this.  I will see her in 6 months for reevaluation.   Time  spent: 25 minutes Troy Sine, MD, [facility] 03/22/2018 3:04 PM

## 2018-03-22 NOTE — Patient Instructions (Signed)
Medication Instructions:  Your physician recommends that you continue on your current medications as directed. Please refer to the Current Medication list given to you today.  Follow-Up: Your physician wants you to follow-up in: 6 months with Dr. Tresa EndoKelly. You will receive a reminder letter in the mail two months in advance. If you don't receive a letter, please call our office to schedule the follow-up appointment.   Any Other Special Instructions Will Be Listed Below (If Applicable).  Try Tylenol for pain  We will send orders to [facility] for a nasal mask  If you need a refill on your cardiac medications before your next appointment, please call your pharmacy.

## 2018-03-24 ENCOUNTER — Other Ambulatory Visit: Payer: Self-pay | Admitting: Cardiovascular Disease

## 2018-03-24 NOTE — Telephone Encounter (Signed)
°  Follow up:   Patient calling back concerning her medication change. Patient called the pharmacy this morning and they said the Dr. Tresa EndoKelly dined her Ferrex 150 mg for her blood. Patient wants to know  Why.

## 2018-03-24 NOTE — Telephone Encounter (Signed)
Returned call to patient, advised Dr. Tresa EndoKelly would like to defer to PCP for further refills.   Patient aware and will make PCP aware.

## 2018-03-31 ENCOUNTER — Other Ambulatory Visit: Payer: Self-pay | Admitting: Cardiovascular Disease

## 2018-04-11 ENCOUNTER — Telehealth: Payer: Self-pay | Admitting: Cardiovascular Disease

## 2018-04-11 NOTE — Telephone Encounter (Signed)
New Message:     Please call, pt said she is having problem with nose pice on her C-Pap.

## 2018-04-12 NOTE — Telephone Encounter (Signed)
Returned a call to patient. She informs me that she cannot use her CPAP machine. Dr Tresa Endokelly changed her mask from a FF to a "nasal" mask. States the air pressure is causing her nose to burn really bad. Informed her that I will let Dr Tresa Endokelly know. I will contact her with his recommendations.

## 2018-04-12 NOTE — Telephone Encounter (Signed)
Called patient to inform her that Dr Tresa Endokelly reviewed CPAP download and phone  message from yesterday. He has ordered pressure change. She is to notify us again if she continues to have problems. Patient voiced understanding.

## 2018-04-18 ENCOUNTER — Ambulatory Visit: Payer: Medicare Other | Admitting: Cardiovascular Disease

## 2018-05-04 ENCOUNTER — Other Ambulatory Visit: Payer: Self-pay | Admitting: Cardiovascular Disease

## 2018-07-21 ENCOUNTER — Other Ambulatory Visit: Payer: Self-pay | Admitting: Cardiovascular Disease

## 2018-08-23 DIAGNOSIS — J0100 Acute maxillary sinusitis, unspecified: Secondary | ICD-10-CM | POA: Insufficient documentation

## 2018-08-23 DIAGNOSIS — J3089 Other allergic rhinitis: Secondary | ICD-10-CM | POA: Insufficient documentation

## 2018-10-12 ENCOUNTER — Telehealth: Payer: Self-pay | Admitting: Cardiovascular Disease

## 2018-10-12 NOTE — Telephone Encounter (Signed)
   Cardiac Questionnaire:    Since your last visit or hospitalization:    1. Have you been having new or worsening chest pain? No   2. Have you been having new or worsening shortness of breath? No 3. Have you been having new or worsening leg swelling, wt gain, or increase in abdominal girth (pants fitting more tightly)? No   4. Have you had any passing out spells? No    *A YES to any of these questions would result in the appointment being kept. *If all the answers to these questions are NO, we should indicate that given the current situation regarding the worldwide coronarvirus pandemic, at the recommendation of the CDC, we are looking to limit gatherings in our waiting area, and thus will reschedule their appointment beyond four weeks from today.   _____________   Sherron Monday to pt and informed that we are cancelling appointments due to COVID 19 and CDC recommendations if having no problems. Pt answered all questions and stated she has been doing fine. She stated she does occasionally have shortness of breath and occasional AFIB spells, but that this is not abnormal and she has been having these symptoms for a long time. I explained to pt that we will be calling to reschedule appointments when schedules start to open back up. Pt verbalized understanding and thanks for the call.

## 2018-10-16 ENCOUNTER — Other Ambulatory Visit: Payer: Self-pay | Admitting: Cardiovascular Disease

## 2018-10-16 ENCOUNTER — Ambulatory Visit: Payer: Medicare Other | Admitting: Cardiovascular Disease

## 2018-10-17 ENCOUNTER — Other Ambulatory Visit: Payer: Self-pay | Admitting: Cardiovascular Disease

## 2018-10-30 ENCOUNTER — Telehealth: Payer: Self-pay | Admitting: Physician Assistant

## 2018-10-30 NOTE — Telephone Encounter (Signed)
   TELEPHONE CALL NOTE - RESCHEDULING [facility] CANCELLATIONS DUE TO COVID-19  Primary Cardiologist:Thomas Tresa Endo, MD  This patient has been deemed a candidate for a follow-up tele-health visit to limit community exposure during the Covid-19 pandemic. Patient has declined MyChart.   Please set up for a telehealth visit with Dr. Tresa Endo or an APP on the team, can be beyond 2 weeks.  I will forward this appointment request to the appropriate scheduling pool and the primary cardiologist's assist. I will remove this patient from the C19 cancellation pool.   Roe Rutherford [facility], PA 10/30/2018, 4:25 PM

## 2018-11-01 ENCOUNTER — Telehealth: Payer: Self-pay | Admitting: Physician Assistant

## 2018-11-02 NOTE — Telephone Encounter (Signed)
Checking in with Julie Kemp to see if any available spots for this patient to do a virtual visit in 2 weeks    Thanks!

## 2018-11-06 NOTE — Telephone Encounter (Signed)
YesGaspar Kemp, can you arrange

## 2018-11-06 NOTE — Telephone Encounter (Signed)
error 

## 2018-11-09 ENCOUNTER — Telehealth: Payer: Self-pay

## 2018-11-09 NOTE — Telephone Encounter (Signed)
Called and left a voice message for the patient to give our office a call to get scheduled for a virtual visit with Azalee Course, PA-C

## 2018-11-09 NOTE — Telephone Encounter (Signed)
Called and left a voice message for the patient to give our office a call to get scheduled for a virtual visit with Hao Meng, PA-C  

## 2018-11-20 ENCOUNTER — Telehealth: Payer: Self-pay

## 2018-11-20 NOTE — Telephone Encounter (Signed)
Following up with patient to get her scheduled for a follow-up with Dr. Tresa Endo or Azalee Course, PA-C and saw she was scheduled with Dr. Dulce Sellar for a June 2020 appointment. Called  Julie Kemp to confirm her appointment and she stated that she would like to switch to Dr. Hulen Shouts care due to the [facility] office being closer to her home.

## 2018-11-20 NOTE — Telephone Encounter (Signed)
Julie Kemp is wanting to to switch providers. I sent an encounter to Dr. Dulce Sellar and Dr. Tresa Endo about her wanting to switch.

## 2018-11-20 NOTE — Telephone Encounter (Signed)
OK 

## 2018-11-22 NOTE — Telephone Encounter (Signed)
ok 

## 2018-12-08 ENCOUNTER — Telehealth: Payer: Self-pay | Admitting: Cardiology

## 2018-12-08 DIAGNOSIS — D649 Anemia, unspecified: Secondary | ICD-10-CM

## 2018-12-08 DIAGNOSIS — I4891 Unspecified atrial fibrillation: Secondary | ICD-10-CM

## 2018-12-08 DIAGNOSIS — Z7901 Long term (current) use of anticoagulants: Secondary | ICD-10-CM

## 2018-12-08 DIAGNOSIS — R079 Chest pain, unspecified: Secondary | ICD-10-CM | POA: Diagnosis not present

## 2018-12-08 DIAGNOSIS — R0602 Shortness of breath: Secondary | ICD-10-CM

## 2018-12-08 NOTE — Telephone Encounter (Signed)
Telephone call to patient to evaluate her shortness of breath. Left message to call back

## 2018-12-08 NOTE — Telephone Encounter (Signed)
Patient daughter reporting patient is having difficulty breathing, will not drive to [facility] to see [facility] so put her in with RRR 5/20. Did not know if [facility] wanted to squeeze her in as virtual Monday or Tuesday.

## 2018-12-08 NOTE — Telephone Encounter (Signed)
Left message for patient to call back regarding her shortness of breath.

## 2018-12-09 DIAGNOSIS — R0602 Shortness of breath: Secondary | ICD-10-CM | POA: Diagnosis not present

## 2018-12-09 DIAGNOSIS — I4891 Unspecified atrial fibrillation: Secondary | ICD-10-CM | POA: Diagnosis not present

## 2018-12-09 DIAGNOSIS — Z7901 Long term (current) use of anticoagulants: Secondary | ICD-10-CM | POA: Diagnosis not present

## 2018-12-09 DIAGNOSIS — R079 Chest pain, unspecified: Secondary | ICD-10-CM | POA: Diagnosis not present

## 2018-12-11 ENCOUNTER — Telehealth: Payer: Self-pay | Admitting: Cardiology

## 2018-12-11 NOTE — Telephone Encounter (Signed)
Called patient regarding Office visit schedule for RR this Wednesday.. She is a patient of [facility] but refuses to go to the [facility] office and she really sounds bad with cough and SOB.  Can you call patient and triage to see if she needs Televisit or in office visit with DOD this week??

## 2018-12-11 NOTE — Telephone Encounter (Signed)
Telephone call to patient. States she went to [facility] for shortness of breath over the weekend. States she was briefly placed on Bipap  and only stayed 1 night. Still feels short of breath at rest. Denies recent weight gain or swelling, States has some chest pressure that Tums relieves. Will get records from [facility] for Dr. Dulce Sellar to review and advise. Currently has an appointment with Dr. Tomie China Wed 12/13/18 at 2:10PM

## 2018-12-11 NOTE — Telephone Encounter (Signed)
Patient states she was hospitalized in [place] on 12/08/18. She is currently very SOB and states this has been going on for a week or so. she is on the way for a mammogram in [place] and could not give a lot of information for fear of being late to her appt. RN advised patient to try and call Dr. Sherral Hammers office for a visit if possible today. RN informed patient that she will call back after lunch for more information on patients status and try to get some intervention for her new onset health issues. [facility] records pulled and to be sent to Dr. Janey Genta for review by L.Webb Silversmith.

## 2018-12-12 ENCOUNTER — Other Ambulatory Visit: Payer: Self-pay

## 2018-12-12 ENCOUNTER — Ambulatory Visit (INDEPENDENT_AMBULATORY_CARE_PROVIDER_SITE_OTHER): Payer: Medicare Other | Admitting: Allergy

## 2018-12-12 ENCOUNTER — Encounter: Payer: Self-pay | Admitting: Allergy

## 2018-12-12 VITALS — BP 134/90 | HR 69 | Temp 98.1°F | Resp 22 | Ht 63.8 in | Wt 144.4 lb

## 2018-12-12 DIAGNOSIS — R0602 Shortness of breath: Secondary | ICD-10-CM | POA: Diagnosis not present

## 2018-12-12 DIAGNOSIS — J3089 Other allergic rhinitis: Secondary | ICD-10-CM | POA: Diagnosis not present

## 2018-12-12 DIAGNOSIS — J4530 Mild persistent asthma, uncomplicated: Secondary | ICD-10-CM

## 2018-12-12 MED ORDER — LEVALBUTEROL HCL 1.25 MG/3ML IN NEBU: INHALATION_SOLUTION | RESPIRATORY_TRACT | 1 refills | Status: DC

## 2018-12-12 MED ORDER — BUDESONIDE 0.5 MG/2ML IN SUSP: RESPIRATORY_TRACT | 5 refills | Status: DC

## 2018-12-12 MED ORDER — LEVALBUTEROL HCL 1.25 MG/3ML IN NEBU
1.2500 mg | INHALATION_SOLUTION | Freq: Once | RESPIRATORY_TRACT | Status: AC
  Administered 2018-12-12: 15:00:00 1.25 mg via RESPIRATORY_TRACT

## 2018-12-12 NOTE — Progress Notes (Signed)
New Patient Note  RE: Julie Kemp MRN: [id number] DOB: January 21, 1941 Date of Office Visit: 12/12/2018  Referring provider: No ref. provider found Primary care provider: Hadley Pen, MD  Chief Complaint: short of breath  History of present illness: Julie Kemp is a 78 y.o. female presenting today for consultation for shortness of breath.  A family member made appt for her.    She has history of asthma, paroxysmal A. Fib on anticoagulation, sick sinus syndrome, cerebellar degeneration and environmental allergies.   She follows with cardiology and currently has an appt with Dr. Tomie China tomorrow.    She saw her PCP on 5/15 to get her coumadin checked.  She states she started having SOB about 2-3 days prior.  SOB is at rest and with exertion.  She does have periods where she is back to normal like last night she states she was not SOB but it returned during the day.  Denies nighttime awakenings.  She does states that she has been having chest pain and has had some relief with taking tums and then burping or passing gas.  On exam per records she was noted to have wheezing.  She was given a kenalog shot.  She states she went home and a family member was concerned with her breathing and called EMS.  She was noted to have O2 saturation of 84% placed on a non-rebreather.  In the ED she was placed on 4L [place] and given a neb treatment.  She was admitted overnight for observation.  EKG with sinus bradycardia with no evidence of ischemia.  CXR was normal ecxept for noted aortic atherosclerosis.  She had inpatient COVID testing which was negative.  While in the hospital she declined a stress test.  She was able to wean to RA prior to discharge.  Labs in hospital include CBC and CMP largely unremarkable. Negative troponins. Normal TSH.  Normal UA.  ABG did ph 7.48 with mildly low CO2 33.38mmhg, PO2 , bicarb 24.39mmol/l.  PT-inr 2.4 and PT 23.0, PTT 34.1.  Negative blood cultures.   She states  she has been afebrile without chills/night sweats.  She does report having palpitations and some nausea. She also reports she has coughing which is her baseline along with runny nose and sneezing.    She reports adult onset asthma and states her current symptoms are not typical of her past exacerbations.  She states she has not had any inhalers in over 10 years or so.  She believes she has used in the past albuterol inhaler, serovent and alvesco looking inhaler.  She states she does not like inhalers or mouthpieces and she does not feel she can inhale deep enough to properly use the medication.  She had a nebulizer but states it has also been about 10 years since last used.   In regards to her allergy symptoms she states she can not use nasal sprays and it "strangles" her and she also can't take antihistamines due to her heart conditions.  She has received kenalog injections in the past to help control her allergy symptoms.   She does report having eczema mostly on her flanks and she does use anti-itch cream. Denies food allergy.    Review of systems: Review of Systems  Constitutional: Negative for chills, diaphoresis, fever and malaise/fatigue.  HENT: Negative for congestion, sinus pain and sore throat.   Eyes: Negative for discharge and redness.  Respiratory: Positive for cough, shortness of breath and wheezing. Negative for  sputum production.   Cardiovascular: Positive for chest pain and palpitations.  Gastrointestinal: Positive for heartburn and nausea. Negative for abdominal pain, constipation, diarrhea and vomiting.  Musculoskeletal: Negative for myalgias.  Skin: Negative for itching and rash.  Neurological: Negative for headaches.    All other systems negative unless noted above in HPI  Past medical history: Past Medical History:  Diagnosis Date  . Asthma   . Atrial fibrillation (HCC)    on coumadin/  . CAD (coronary artery disease) 12/21/12   This is a normal carotid duplex  Doppler evaluation  . Coronary artery disease 03/08/2013   The cavity size was normal . Wall thickness was increased in a pattern of mild LVH . Systolic function was normal . The estimated ejection fraction of 55%to60% . Wall motion was normal . There were no regional  wall motion abnormalities.   . Eczema   . Heart murmur    office visit 03/01/2013  . Heart murmur 10/11/2001   Normal myocardial perfushion study.No significant ischemia demonstrated. This is a low risk scan  . Unsteady gait     Past surgical history: Past Surgical History:  Procedure Laterality Date  . ABDOMINAL HYSTERECTOMY    . Left femoral hernia repair    . lymph nodes     left groin lymph node biopsies for benign reasons  . RECTOCELE REPAIR      Family history:  Family History  Problem Relation Age of Onset  . Heart failure Mother   . Asthma Mother   . Heart failure Father   . Diabetes Father   . Cancer Sister     Social history: Lives in a trailer without carpeting with no pets with electric heating.  She is retired from being a Production assistant, radioserver.  Denies smoking history.     Medication List: Allergies as of 12/12/2018      Reactions   Zithromax [azithromycin] Nausea And Vomiting   Amoxicillin Other (See Comments)   Can take Ceftin-per note   Sulfa Antibiotics Nausea Only      Medication List       Accurate as of Dec 12, 2018  3:44 PM. If you have any questions, ask your nurse or doctor.        STOP taking these medications   ELDERBERRY PO Stopped by:  Andreus Cure Larose HiresPatricia Kilynn Fitzsimmons, MD   FIBER PO Stopped by:  Macrina Lehnert Larose HiresPatricia Temia Debroux, MD   MAGNESIUM PO Stopped by:  Catarina Huntley Larose HiresPatricia Paraskevi Funez, MD   multivitamin with minerals Tabs tablet Stopped by:  Sanaai Doane Larose HiresPatricia Marquan Vokes, MD   TRIAZOLAM PO Stopped by:  Lonnetta Kniskern Larose HiresPatricia Hedy Garro, MD   TURMERIC PO Stopped by:  Issiac Jamar Larose HiresPatricia Jeven Topper, MD     TAKE these medications   budesonide 0.5 MG/2ML nebulizer solution Commonly known as:  PULMICORT  Inhale the contents of one vial in nebulizer twice daily as directed. Started by:  Anisa Leanos Larose HiresPatricia Aleasha Fregeau, MD   CALCIUM 1200 PO Take 1 tablet by mouth 2 (two) times daily.   ezetimibe 10 MG tablet Commonly known as:  ZETIA Take 10 mg by mouth daily.   fenofibrate 160 MG tablet TAKE 1 TABLET BY MOUTH  DAILY   Ferrex 150 150 MG capsule Generic drug:  iron polysaccharides TAKE 1 CAPSULE BY MOUTH EVERY DAY   FISH OIL PO Take 2 capsules by mouth 2 (two) times daily. Taking Nature Bounty 1200 mg   levalbuterol 1.25 MG/3ML nebulizer solution Commonly known as:  XOPENEX 1 vial in neb 4-6 hours PRN, cough,  wheeze, SOBr, chest tightness Started by:  Tyrece Vanterpool Larose Hires, MD   metoprolol succinate 25 MG 24 hr tablet Commonly known as:  TOPROL-XL TAKE 1/2 TABLET (12.5 MGS) BY MOUTH ONCE DAILY.   MULTIVITAMIN WOMEN 50+ PO Take by mouth daily.   OVER THE COUNTER MEDICATION Take 1 capsule by mouth daily. EPICOR   pantoprazole 40 MG tablet Commonly known as:  PROTONIX TAKE 1 TABLET BY MOUTH  DAILY   polyethylene glycol 17 g packet Commonly known as:  MIRALAX / GLYCOLAX Take 17 g by mouth daily.   Probiotic Daily Caps Take 1 capsule by mouth every evening.   sotalol 80 MG tablet Commonly known as:  BETAPACE Take 1 tablet (80 mg total) by mouth 2 (two) times daily. What changed:  Another medication with the same name was removed. Continue taking this medication, and follow the directions you see here. Changed by:  Nashiya Disbrow Larose Hires, MD   warfarin 2 MG tablet Commonly known as:  COUMADIN What changed:  Another medication with the same name was removed. Continue taking this medication, and follow the directions you see here. Changed by:  Amy Belloso Larose Hires, MD   warfarin 2.5 MG tablet Commonly known as:  COUMADIN What changed:  Another medication with the same name was removed. Continue taking this medication, and follow the directions you see here. Changed  by:  Diamond Jentz Larose Hires, MD       Known medication allergies: Allergies  Allergen Reactions  . Zithromax [Azithromycin] Nausea And Vomiting  . Amoxicillin Other (See Comments)    Can take Ceftin-per note  . Sulfa Antibiotics Nausea Only     Physical examination: Blood pressure 134/90, pulse 69, temperature 98.1 F (36.7 C), temperature source Tympanic, resp. rate (!) 22, height 5' 3.8" (1.621 m), weight 144 lb 6.4 oz (65.5 kg), SpO2 98 %.  General: Alert, interactive, visibly SOB. HEENT: PERRLA, TMs pearly gray, turbinates minimally edematous with clear discharge, post-pharynx non erythematous. Neck: Supple without lymphadenopathy. Lungs: Decreased breath sounds bilaterally without wheezing, rhonchi or rales. {increased work of breathing.  Aeration improved and pt able to take deeper breaths following xopenex neb CV: Normal S1, S2 without murmurs. Abdomen: Nondistended, nontender. Skin: Warm and dry, without lesions or rashes. Extremities:  No clubbing, cyanosis or edema. Neuro:   Grossly intact.  Diagnositics/Labs: Labs: reviewed in HPI  Spirometry: deferred due to extreme SOB Xopenex neb given in office by staff wearing n95 mask PPE.   Assessment and plan:   Shortness of breath with history of asthma  - recent hospital stay for shortness of breath and chest pain  - COVID testing was negative  - remains with shortness of breath, wheezing   - lung exam improved after use of Xopenex via nebulizer  - will equip with nebulizer machine and Xopenex vials to use 1 vial every 4-6 hours as needed for cough/wheeze/shortness of breath/chest tightness.  Monitor frequency of use.    - for maintenance of symptoms start Budesonide 0.5mg  1 vial twice a day via nebulizer  - you might benefit from Singulair  daily.  This is not an antihistamine but can provide control in both asthma symptoms and allergy symptoms.  Ask your cardiologist if this is safe to use alongside your heart  medications and let us know.    - she has cardiology appt tomorrow to help determine if her current symptoms are more cardiac in nature  Allergies  - if can use singulair would recommend starting this medication for  allergy symptom control of your sneezing, cough and runny nose  - once shortness of breath is under control recommend allergy testing to help with avoidance measures   Follow-up 4-6 weeks or sooner if needed  I appreciate the opportunity to take part in Corda's care. Please do not hesitate to contact me with questions.  Sincerely,   Margo Aye, MD Allergy/Immunology [facility]

## 2018-12-12 NOTE — Patient Instructions (Addendum)
Shortness of breath with history of asthma  - recent hospital stay for shortness of breath and chest pain  - COVID testing was negative  - remains with shortness of breath, wheezing   - lung exam improved after use of Xopenex via nebulizer  - will equip with nebulizer machine and Xopenex vials to use 1 vial every 4-6 hours as needed for cough/wheeze/shortness of breath/chest tightness.  Monitor frequency of use.    - for maintenance of symptoms start Budesonide 0.5mg  1 vial twice a day via nebulizer  - you might benefit from Singulair 10mg  daily.  This is not an antihistamine but can provide control in both asthma symptoms and allergy symptoms.  Ask your cardiologist if this is safe to use alongside your heart medications and let us know.    Allergies  - if can use singulair would recommend starting this medication for allergy symptom control of your sneezing, cough and runny nose  - once shortness of breath is under control recommend allergy testing to help with avoidance measures   Follow-up 4-6 weeks or sooner if needed

## 2018-12-13 ENCOUNTER — Telehealth (INDEPENDENT_AMBULATORY_CARE_PROVIDER_SITE_OTHER): Payer: Medicare Other | Admitting: Cardiology

## 2018-12-13 ENCOUNTER — Encounter: Payer: Self-pay | Admitting: Cardiology

## 2018-12-13 VITALS — Ht 64.0 in | Wt 144.4 lb

## 2018-12-13 DIAGNOSIS — I480 Paroxysmal atrial fibrillation: Secondary | ICD-10-CM | POA: Diagnosis not present

## 2018-12-13 DIAGNOSIS — J45909 Unspecified asthma, uncomplicated: Secondary | ICD-10-CM

## 2018-12-13 DIAGNOSIS — R079 Chest pain, unspecified: Secondary | ICD-10-CM

## 2018-12-13 DIAGNOSIS — R0600 Dyspnea, unspecified: Secondary | ICD-10-CM

## 2018-12-13 DIAGNOSIS — I2510 Atherosclerotic heart disease of native coronary artery without angina pectoris: Secondary | ICD-10-CM

## 2018-12-13 DIAGNOSIS — Z7901 Long term (current) use of anticoagulants: Secondary | ICD-10-CM

## 2018-12-13 NOTE — Progress Notes (Signed)
Virtual Visit via Video Note   This visit type was conducted due to national recommendations for restrictions regarding the COVID-19 Pandemic (e.g. social distancing) in an effort to limit this patient's exposure and mitigate transmission in our community.  Due to her co-morbid illnesses, this patient is at least at moderate risk for complications without adequate follow up.  This format is felt to be most appropriate for this patient at this time.  All issues noted in this document were discussed and addressed.  A limited physical exam was performed with this format.  Please refer to the patient's chart for her consent to telehealth for [facility].   Date:  12/13/2018   ID:  Julie, Kemp February 02, 1941, MRN [id number]  Patient Location: Home Provider Location: Office  PCP:  Hadley Pen, MD  Cardiologist:  Nicki Guadalajara, MD  Electrophysiologist:  None   Evaluation Performed:  Follow-Up Visit  Chief Complaint: Follow-up after recent [facility] stay with worsened asthma chest pain shortness of breath and hypoxia home saturation 84%  History of Present Illness:    Julie Kemp is a 78 y.o. female with paroxysmal atrial fibrillation last seen by Dr. Tresa Endo 03/22/2018  She reestablished care with Dr. Tresa Endo 03/22/2018 she has a history of sick sinus syndrome paroxysmal atrial fibrillation has been on chronic sotalol therapy and chronic anticoagulation with warfarin.  Remote echocardiogram in 2014 showed normal left ventricular function and moderate mitral regurgitation mild to moderate left atrial enlargement and mild right atrial enlargement myocardial perfusion study in 2010 was read as normal.  Is also found to have sleep apnea and treated with CPAP.  The patient does not have symptoms concerning for COVID-19 infection (fever, chills, cough, or new shortness of breath).   I reviewed to [facility] records admitted 515 discharge 516 admitted to the hospital with  shortness of breath and bronchospasm.  She has complaints of chest pain serial troponins were normal EKG did not show ischemia she was advised and declined an ischemia evaluation.  She remained in sinus rhythm throughout the hospitalization and was continued on her sotalol and Coumadin there is a notation she is anemic but her hemoglobin is 12.3 and no evidence of bleeding.  Test done during the hospitalization included serial troponin undetectable CBC with initial hemoglobin of 12.3 MCV was normal renal function was normal proBNP level normal at 248 INR was 2.3.  Recently seen by her allergist who recommended her to start taking a long-acting maintenance bronchodilator and Singulair  Unfortunately she never filled her prescription for bronchodilator and asked her to send her family she remains short of breath she is wheezing and is uncomfortable she is not having chest pain.  I reviewed her cardiology records with her.  She has no edema orthopnea palpitation or syncope I told her she is taking 2 medications that can exacerbate or provoke asthma both metoprolol and sotalol I asked her to stop her metoprolol we discussed a 2-day holiday from sotalol but she is afraid to stop it because of frequent and bothersome palpitation.  She is at risk for and asked her to have an echocardiogram done class I in the office and I will do a follow-up video visit in 2 weeks.  During the visit I coached her in every conceivable way to do a video visit and it became obvious she did not have Internet access and I asked her to have her granddaughter present at the next so that we can do a  true virtual video visit.  Prior to this visit I reviewed her records from [facility]. Past Medical History:  Diagnosis Date   Asthma    Atrial fibrillation (HCC)    on coumadin/   CAD (coronary artery disease) 12/21/12   This is a normal carotid duplex Doppler evaluation   Coronary artery disease 03/08/2013   The cavity size  was normal . Wall thickness was increased in a pattern of mild LVH . Systolic function was normal . The estimated ejection fraction of 55%to60% . Wall motion was normal . There were no regional  wall motion abnormalities.    Eczema    Heart murmur    office visit 03/01/2013   Heart murmur 10/11/2001   Normal myocardial perfushion study.No significant ischemia demonstrated. This is a low risk scan   Unsteady gait    Past Surgical History:  Procedure Laterality Date   ABDOMINAL HYSTERECTOMY     Left femoral hernia repair     lymph nodes     left groin lymph node biopsies for benign reasons   RECTOCELE REPAIR       No outpatient medications have been marked as taking for the 12/13/18 encounter (Appointment) with Baldo Daub, MD.     Allergies:   Zithromax [azithromycin]; Amoxicillin; and Sulfa antibiotics   Social History   Tobacco Use   Smoking status: Never Smoker   Smokeless tobacco: Never Used  Substance Use Topics   Alcohol use: No    Alcohol/week: 0.0 standard drinks   Drug use: No     Family Hx: The patient's family history includes Asthma in her mother; Cancer in her sister; Diabetes in her father; Heart failure in her father and mother.  ROS:   Please see the history of present illness.     All other systems reviewed and are negative.   Prior CV studies:   The following studies were reviewed today:    Labs/Other Tests and Data Reviewed:     Recent Lipid Panel Lab Results  Component Value Date/Time   CHOL 167 06/01/2016 09:00 AM   TRIG 107 06/01/2016 09:00 AM   HDL 54 06/01/2016 09:00 AM   CHOLHDL 3.1 06/01/2016 09:00 AM   LDLCALC 92 06/01/2016 09:00 AM    Wt Readings from Last 3 Encounters:  12/12/18 144 lb 6.4 oz (65.5 kg)  03/22/18 121 lb 6.4 oz (55.1 kg)  01/12/18 122 lb 9.6 oz (55.6 kg)     Objective:    Vital Signs:  There were no vitals taken for this visit.   VITAL SIGNS:  reviewed she was hoarse during the visit but  no obvious respiratory distress and I could not hear wheezing.  ASSESSMENT & PLAN:    1. Shortness of breath which appears to be due to bronchospasm I think at this time is best withdrawn a beta-blocker which she allowed me to stop her sotalol for a few days I asked her to initiate her bronchodilators we will do an echocardiogram to assess for cardiac etiology but I doubt she is developed severe valvular heart disease or evidence of heart failure without findings on x-ray and with a normal proBNP level. 2. Atrial fibrillation stable reluctantly continue sotalol anticoagulant 3. Asthma worsened I asked her to please initiate her bronchodilators send her granddaughter to the pharmacy 4. Chest pain quite atypical has not had it since she left the hospital at this time I would not pursue an ischemic evaluation I reviewed her records and she has  no documented CAD  COVID-19 Education: The signs and symptoms of COVID-19 were discussed with the patient and how to seek care for testing (follow up with PCP or arrange E-visit).  The importance of social distancing was discussed today.  Time:   Today, I have spent 30 minutes including the time [facility] and formulating a treatment plan with the patient with the patient with telehealth technology discussing the above problems.     Medication Adjustments/Labs and Tests Ordered: Current medicines are reviewed at length with the patient today.  Concerns regarding medicines are outlined above.   Tests Ordered: No orders of the defined types were placed in this encounter.   Medication Changes: No orders of the defined types were placed in this encounter.   Disposition:  Follow up two weeks after echocardiogram and initiating bronchodilator  Signed, Norman HerrlichBrian Nico Rogness, MD  12/13/2018 2:37 PM

## 2018-12-13 NOTE — Patient Instructions (Addendum)
Medication Instructions:  Your physician has recommended you make the following change in your medication:  STOP Metoprolol (Toprol )12.5 mg  If you need a refill on your cardiac medications before your next appointment, please call your pharmacy.   Lab work: NOne If you have labs (blood work) drawn today and your tests are completely normal, you will receive your results only by: Marland Kitchen MyChart Message (if you have MyChart) OR . A paper copy in the mail If you have any lab test that is abnormal or we need to change your treatment, we will call you to review the results.  Testing/Procedures: Your physician has requested that you have an echocardiogram. Echocardiography is a painless test that uses sound waves to create images of your heart. It provides your doctor with information about the size and shape of your heart and how well your heart's chambers and valves are working. This procedure takes approximately one hour. There are no restrictions for this procedure.  Your appointment  is June 5,2020 AT 8:15   Follow-Up: At [facility], you and your health needs are our priority.  As part of our continuing mission to provide you with exceptional heart care, we have created designated Provider Care Teams.  These Care Teams include your primary Cardiologist (physician) and Advanced Practice Providers (APPs -  Physician Assistants and Nurse Practitioners) who all work together to provide you with the care you need, when you need it. You will need a follow up appointment in 2 weeks.  Any Other Special Instructions Will Be Listed Below (If Applicable).

## 2018-12-13 NOTE — Addendum Note (Signed)
Addended by: Fayrene Fearing B on: 12/13/2018 05:11 PM   Modules accepted: Orders

## 2018-12-13 NOTE — Telephone Encounter (Signed)
Patient has a virtual visit with Dr. Dulce Sellar today, 12/13/2018, at 3:45 pm.

## 2018-12-28 DIAGNOSIS — L821 Other seborrheic keratosis: Secondary | ICD-10-CM | POA: Insufficient documentation

## 2018-12-29 ENCOUNTER — Other Ambulatory Visit: Payer: Medicare Other

## 2019-01-01 NOTE — Progress Notes (Signed)
Virtual Visit via Telephone Note   This visit type was conducted due to national recommendations for restrictions regarding the COVID-19 Pandemic (e.g. social distancing) in an effort to limit this patient's exposure and mitigate transmission in our community.  Due to her co-morbid illnesses, this patient is at least at moderate risk for complications without adequate follow up.  This format is felt to be most appropriate for this patient at this time.  The patient did not have access to video technology/had technical difficulties with video requiring transitioning to audio format only (telephone).  All issues noted in this document were discussed and addressed.  No physical exam could be performed with this format.  Please refer to the patient's chart for her  consent to telehealth for [facility].   Date:  01/02/2019   ID:  Julie Kemp, Julie Kemp Sep 04, 1940, MRN [id number]  Patient Location: Home Provider Location: Office  PCP:  Myrlene Broker, MD  Cardiologist:  Shirlee More, MD  Electrophysiologist:  None   Evaluation Performed:  Follow-Up Visit  Chief Complaint:  78yo female PMH PAF, SSS, chronic anticoagulation presents for 2 week follow up of shortness of breath.   History of Present Illness:    Julie Kemp is a 78 y.o. female with PMH paroxysmal atrial fibrillation, sick sinus syndrome, chronic anticoagulation, OSA on CPAP last seen 12/13/18 for post [facility] follow up. She was hospitalized for SOB and bronchospasm. Serial troponins were normal, EKG without ischemia, declines ischemia evaluation. At our office visit 12/13/18 her metoprolol was discontinued due to SOB and bronchospasm and echocardiogram was ordered.   Echo 2014 normal LVEF and moderate mitral regurgitation, mild to moderate LAE, mild RAE. Normal myocardial perfusion study 2010.   The patient does not have symptoms concerning for COVID-19 infection (fever, chills, cough, or new shortness of breath).    Even with her granddaughter's assistance and coaching by me we could not establish a video link and this was transitioned to a phone visit.  She has not checked her blood pressure for several weeks but tells me she is slowly steadily improved less cough less wheezing and is using her bronchodilator.  No edema orthopnea chest pain palpitation or syncope.  I reviewed the results of her echocardiogram and were both reassured that she does not have heart failure.  She continues to take warfarin her INR 12/28/2018 was 2.1 and she has some bruising where she struck her leg. Past Medical History:  Diagnosis Date  . Asthma   . Atrial fibrillation ([place])    on coumadin/  . CAD (coronary artery disease) 12/21/12   This is a normal carotid duplex Doppler evaluation  . Coronary artery disease 03/08/2013   The cavity size was normal . Wall thickness was increased in a pattern of mild LVH . Systolic function was normal . The estimated ejection fraction of 55%to60% . Wall motion was normal . There were no regional  wall motion abnormalities.   . Eczema   . Heart murmur    office visit 03/01/2013  . Heart murmur 10/11/2001   Normal myocardial perfushion study.No significant ischemia demonstrated. This is a low risk scan  . Unsteady gait    Past Surgical History:  Procedure Laterality Date  . ABDOMINAL HYSTERECTOMY    . Left femoral hernia repair    . lymph nodes     left groin lymph node biopsies for benign reasons  . RECTOCELE REPAIR       Current Meds  Medication  Sig  . budesonide (PULMICORT) 0.5 MG/2ML nebulizer solution Inhale the contents of one vial in nebulizer twice daily as directed.  . Calcium Carbonate-Vit D-Min (CALCIUM 1200 PO) Take 1 tablet by mouth 2 (two) times daily.  Marland Kitchen. ezetimibe (ZETIA) 10 MG tablet Take 10 mg by mouth daily.  . fenofibrate 160 MG tablet TAKE 1 TABLET BY MOUTH  DAILY  . FERREX 150 150 MG capsule TAKE 1 CAPSULE BY MOUTH EVERY DAY  . levalbuterol (XOPENEX) 1.25  MG/3ML nebulizer solution 1 vial in neb 4-6 hours PRN, cough, wheeze, SOBr, chest tightness  . magnesium oxide (MAG-OX) 400 MG tablet Take 400 mg by mouth 3 (three) times daily.  . Multiple Vitamins-Minerals (MULTIVITAMIN WOMEN 50+ PO) Take by mouth daily.  . Omega-3 Fatty Acids (FISH OIL PO) Take 1 capsule by mouth 3 (three) times daily. Taking Nature Bounty 1200 mg  . OVER THE COUNTER MEDICATION Take 1 capsule by mouth daily. EPICOR  . pantoprazole (PROTONIX) 40 MG tablet TAKE 1 TABLET BY MOUTH  DAILY  . polyethylene glycol (MIRALAX / GLYCOLAX) packet Take 17 g by mouth daily.  . Probiotic Product (PROBIOTIC DAILY) CAPS Take 1 capsule by mouth every evening.  . sotalol (BETAPACE) 80 MG tablet Take 1 tablet (80 mg total) by mouth 2 (two) times daily.  Marland Kitchen. warfarin (COUMADIN) 2 MG tablet Take 2 mg by mouth as directed. Currently alternating 2mg /2.5mg   . warfarin (COUMADIN) 2.5 MG tablet Take 2.5 mg by mouth as directed. Currently alternating 2mg /2.5mg      Allergies:   Zithromax [azithromycin]; Amoxicillin; and Sulfa antibiotics   Social History   Tobacco Use  . Smoking status: Never Smoker  . Smokeless tobacco: Never Used  Substance Use Topics  . Alcohol use: No    Alcohol/week: 0.0 standard drinks  . Drug use: No     Family Hx: The patient's family history includes Asthma in her mother; Cancer in her sister; Diabetes in her father; Heart failure in her father and mother.  ROS:   Please see the history of present illness.     All other systems reviewed and are negative.   Prior CV studies:   The following studies were reviewed today:    Labs/Other Tests and Data Reviewed:    EKG:  An ECG dated 12/09/18 was personally reviewed today and demonstrated:  NSR 62   Recent Labs: No results found for requested labs within last 8760 hours.   Recent Lipid Panel Lab Results  Component Value Date/Time   CHOL 167 06/01/2016 09:00 AM   TRIG 107 06/01/2016 09:00 AM   HDL 54  06/01/2016 09:00 AM   CHOLHDL 3.1 06/01/2016 09:00 AM   LDLCALC 92 06/01/2016 09:00 AM    Wt Readings from Last 3 Encounters:  01/02/19 146 lb (66.2 kg)  12/13/18 144 lb 6.4 oz (65.5 kg)  12/12/18 144 lb 6.4 oz (65.5 kg)     Objective:    Vital Signs:  Wt 146 lb (66.2 kg)   BMI 25.06 kg/m    VITAL SIGNS:  reviewed mood affect thought and cognition are normal no respirartory distress  ASSESSMENT & PLAN:    1. Paroxysmal atrial fibrillation stable continue sotalol anticoagulant and we will do an office EKG when she is seen 3 months 2. Mitral regurgitation clinically not severe no evidence of heart failure in retrospect her shortness of breath was due to bronchospasm. 3. Asthma improved 4. Anticoagulation continue warfarin managed by her PCP  COVID-19 Education: The signs and symptoms  of COVID-19 were discussed with the patient and how to seek care for testing (follow up with PCP or arrange E-visit).  The importance of social distancing was discussed today.  Time:   Today, I have spent 20 minutes with the patient with telehealth technology discussing the above problems.     Medication Adjustments/Labs and Tests Ordered: Current medicines are reviewed at length with the patient today.  Concerns regarding medicines are outlined above.   Tests Ordered: No orders of the defined types were placed in this encounter.   Medication Changes: No orders of the defined types were placed in this encounter.   Disposition:  Follow up in 3 month(s)  Signed, Norman HerrlichBrian Story Vanvranken, MD  01/02/2019 1:36 PM    [facility]

## 2019-01-02 ENCOUNTER — Telehealth (INDEPENDENT_AMBULATORY_CARE_PROVIDER_SITE_OTHER): Payer: Medicare Other | Admitting: Cardiology

## 2019-01-02 ENCOUNTER — Encounter: Payer: Self-pay | Admitting: Cardiology

## 2019-01-02 ENCOUNTER — Other Ambulatory Visit: Payer: Self-pay

## 2019-01-02 VITALS — Wt 146.0 lb

## 2019-01-02 DIAGNOSIS — J4530 Mild persistent asthma, uncomplicated: Secondary | ICD-10-CM

## 2019-01-02 DIAGNOSIS — Z7901 Long term (current) use of anticoagulants: Secondary | ICD-10-CM

## 2019-01-02 DIAGNOSIS — I340 Nonrheumatic mitral (valve) insufficiency: Secondary | ICD-10-CM

## 2019-01-02 DIAGNOSIS — I480 Paroxysmal atrial fibrillation: Secondary | ICD-10-CM | POA: Diagnosis not present

## 2019-01-02 NOTE — Patient Instructions (Signed)

## 2019-01-10 ENCOUNTER — Other Ambulatory Visit: Payer: Self-pay | Admitting: Cardiovascular Disease

## 2019-01-16 ENCOUNTER — Other Ambulatory Visit: Payer: Self-pay

## 2019-01-16 ENCOUNTER — Ambulatory Visit (INDEPENDENT_AMBULATORY_CARE_PROVIDER_SITE_OTHER): Payer: Medicare Other | Admitting: Allergy

## 2019-01-16 ENCOUNTER — Encounter: Payer: Self-pay | Admitting: Allergy

## 2019-01-16 VITALS — BP 110/70 | HR 74 | Temp 97.3°F | Resp 16

## 2019-01-16 DIAGNOSIS — J3089 Other allergic rhinitis: Secondary | ICD-10-CM | POA: Diagnosis not present

## 2019-01-16 DIAGNOSIS — J4530 Mild persistent asthma, uncomplicated: Secondary | ICD-10-CM | POA: Diagnosis not present

## 2019-01-16 DIAGNOSIS — R0602 Shortness of breath: Secondary | ICD-10-CM | POA: Diagnosis not present

## 2019-01-16 MED ORDER — MONTELUKAST SODIUM 10 MG PO TABS: 10.0000 mg | ORAL_TABLET | Freq: Every day | ORAL | 5 refills | Status: DC

## 2019-01-16 NOTE — Progress Notes (Signed)
Follow-up Note  RE: Julie Kemp MRN: [id number] DOB: 21-Apr-1941 Date of Office Visit: 01/16/2019   History of present illness: Julie Kemp is a 78 y.o. female presenting today for follow-up of shortness of breath with history of asthma.  She was last seen in the office on Dec 12, 2018 by myself for initial evaluation.  Since this time she did see her cardiologist who recommended that she stop 1 of her beta-blockers.  At the time of this visit on May 20 of 2020 it was noted that she had not picked up her Pulmicort or Xopenex as recommended by myself at her visit.  At her PCP visit on Dec 14, 2018 she still had not been able to pick up her nebulizer medications. She did finally get her medications and has been taking the Pulmicort nebulizer twice a day.  She states she has not used the Xopenex nebulizer.  She still reports having the shortness of breath feeling. She did go to the ED January 08, 2019 for report of shortness of breath and cough that morning.  Per the ED note it does state that she used her "albuterol nebulizer at home "without initial relief but by the time she got to the ER she was feeling better.  It is unclear from the ED note what was done during the evaluation but she did have a chest x-ray that was normal as well as a CMP that was largely unremarkable except for elevated potassium of 5.4, BUN of 28, low alkaline phosphatase at 35.  CBC was largely unremarkable.  It was noted in the ED note that the physician felt that some of her shortness of breath may be due to anxiety.  She was resting comfortably not tachypneic with oxygen saturation on room air of 92 to 93%.  COVID again was ruled out with negative testing.  She was able to discharge home.  Review of systems: Review of Systems  Constitutional: Negative for chills, fever and malaise/fatigue.  HENT: Negative for congestion, ear discharge, nosebleeds and sore throat.   Eyes: Negative for pain, discharge and redness.   Respiratory: Positive for cough, shortness of breath and wheezing.   Cardiovascular: Negative for chest pain.  Gastrointestinal: Negative for abdominal pain, constipation, diarrhea, heartburn, nausea and vomiting.  Musculoskeletal: Negative for joint pain.  Skin: Negative for itching and rash.  Neurological: Negative for headaches.    All other systems negative unless noted above in HPI  Past medical/social/surgical/family history have been reviewed and are unchanged unless specifically indicated below.  No changes  Medication List: Allergies as of 01/16/2019      Reactions   Zithromax [azithromycin] Nausea And Vomiting   Amoxicillin Other (See Comments)   Can take Ceftin-per note   Sulfa Antibiotics Nausea Only      Medication List       Accurate as of January 16, 2019  1:40 PM. If you have any questions, ask your nurse or doctor.        STOP taking these medications   magnesium oxide 400 MG tablet Commonly known as: MAG-OX Stopped by: Shaylar Larose HiresPatricia Padgett, MD     TAKE these medications   budesonide 0.5 MG/2ML nebulizer solution Commonly known as: PULMICORT Inhale the contents of one vial in nebulizer twice daily as directed.   CALCIUM 1200 PO Take 1 tablet by mouth 2 (two) times daily.   ezetimibe 10 MG tablet Commonly known as: ZETIA Take 10 mg by mouth daily.  fenofibrate 160 MG tablet TAKE 1 TABLET BY MOUTH  DAILY   Ferrex 150 150 MG capsule Generic drug: iron polysaccharides TAKE 1 CAPSULE BY MOUTH EVERY DAY   FISH OIL PO Take 1 capsule by mouth 3 (three) times daily. Taking Nature Bounty 1200 mg   levalbuterol 1.25 MG/3ML nebulizer solution Commonly known as: XOPENEX 1 vial in neb 4-6 hours PRN, cough, wheeze, SOBr, chest tightness   montelukast 10 MG tablet Commonly known as: Singulair Take 1 tablet (10 mg total) by mouth at bedtime. Started by: [place], MD   MULTIVITAMIN WOMEN 50+ PO Take by mouth daily.   OVER THE  COUNTER MEDICATION Take 1 capsule by mouth daily. EPICOR   pantoprazole 40 MG tablet Commonly known as: PROTONIX TAKE 1 TABLET BY MOUTH  DAILY   polyethylene glycol 17 g packet Commonly known as: MIRALAX / GLYCOLAX Take 17 g by mouth daily.   Probiotic Daily Caps Take 1 capsule by mouth every evening.   sotalol 80 MG tablet Commonly known as: BETAPACE Take 1 tablet (80 mg total) by mouth 2 (two) times daily.   warfarin 2 MG tablet Commonly known as: COUMADIN Take 2 mg by mouth as directed. Currently alternating 2mg /2.5mg    warfarin 2.5 MG tablet Commonly known as: COUMADIN Take 2.5 mg by mouth as directed. Currently alternating 2mg /2.5mg        Known medication allergies: Allergies  Allergen Reactions  . Zithromax [Azithromycin] Nausea And Vomiting  . Amoxicillin Other (See Comments)    Can take Ceftin-per note  . Sulfa Antibiotics Nausea Only     Physical examination: Blood pressure 110/70, pulse 74, temperature (!) 97.3 F (36.3 C), temperature source Temporal, resp. rate 16, SpO2 97 %.  General: Alert, interactive, in no acute distress. HEENT: PERRLA, TMs pearly gray, turbinates minimally edematous without discharge, post-pharynx non erythematous. Neck: Supple without lymphadenopathy. Lungs: Clear to auscultation without wheezing, rhonchi or rales. {no increased work of breathing. CV: Normal S1, S2 without murmurs. Abdomen: Nondistended, nontender. Skin: Warm and dry, without lesions or rashes. Extremities:  No clubbing, cyanosis or edema. Neuro:   Grossly intact.  Diagnositics/Labs: None today  Assessment and plan:   Shortness of breath with history of asthma  - remains with shortness of breath, wheezing   - use Xopenex 1 vial via nebulizer every 4-6 hours as needed for cough/wheeze/shortness of breath/chest tightness.  Monitor frequency of use.    - for maintenance of symptoms continue Budesonide 0.5mg  1 vial twice a day via nebulizer  -  Start  Singulair 10mg  daily.  This is not an antihistamine but can provide control in both asthma symptoms and allergy symptoms.  Advised if you start to have change in behavior or mood to stop Singulair and let us know  - if she remains symptomatic despite pulmicort and singulair then will add in performorist to pulmicort to step-up therapy.  Allergies  - start Singulair as above  - once shortness of breath is under control recommend allergy testing to help with avoidance measures   Follow-up 6 weeks or sooner if needed  I appreciate the opportunity to take part in Penni's care. Please do not hesitate to contact me with questions.  Sincerely,   Prudy Feeler, MD Allergy/Immunology [facility]

## 2019-01-16 NOTE — Patient Instructions (Signed)
Shortness of breath with history of asthma  - remains with shortness of breath, wheezing   - use Xopenex 1 vial via nebulizer every 4-6 hours as needed for cough/wheeze/shortness of breath/chest tightness.  Monitor frequency of use.    - for maintenance of symptoms continue Budesonide 0.5mg  1 vial twice a day via nebulizer  -  Start Singulair 10mg  daily.  This is not an antihistamine but can provide control in both asthma symptoms and allergy symptoms.  Advised if you start to have change in behavior or mood to stop Singulair and let us know  Allergies  - start Singulair as above  - once shortness of breath is under control recommend allergy testing to help with avoidance measures   Follow-up 6 weeks or sooner if needed

## 2019-01-17 ENCOUNTER — Ambulatory Visit: Payer: Medicare Other | Admitting: Cardiology

## 2019-01-23 ENCOUNTER — Encounter

## 2019-01-27 ENCOUNTER — Other Ambulatory Visit: Payer: Self-pay | Admitting: Cardiovascular Disease

## 2019-01-30 NOTE — Telephone Encounter (Signed)
Sotalol refill sent to [facility]

## 2019-03-22 ENCOUNTER — Other Ambulatory Visit: Payer: Self-pay | Admitting: Cardiovascular Disease

## 2019-03-27 NOTE — Telephone Encounter (Signed)
Please review for refill. Thank you! 

## 2019-04-05 ENCOUNTER — Telehealth: Payer: Self-pay | Admitting: Cardiology

## 2019-04-05 NOTE — Telephone Encounter (Signed)
Please advise. Thanks.  

## 2019-04-05 NOTE — Telephone Encounter (Signed)
Patient needs a call regarding medications

## 2019-04-05 NOTE — Telephone Encounter (Signed)
Patient advised that Dr Bettina Gavia is aware of the medication changes and she should continue taking sotalol 80mg  in the am, 40mg  at noon, and 80mg  in the evening.   Patient agreed to plan and verbalized understanding.  No further questions.

## 2019-04-05 NOTE — Telephone Encounter (Signed)
Continue same dose 80 40 80

## 2019-04-05 NOTE — Telephone Encounter (Signed)
Patient wanted to make Dr Bettina Gavia aware that she has increased her sotalol due to her "heart going crazy."  She was taking sotalol 80mg  twice daily, but she decided on her own to increase to sotalol 80mg  in the morning, 40mg  at noon, and 80mg  in the evening.   Patient states that she is feeling much better since making these changes and just wanted to make Dr Bettina Gavia aware.  She does not monitor her blood pressures or heart rate at home.    Please advise.

## 2019-04-06 ENCOUNTER — Telehealth: Payer: Medicare Other | Admitting: Cardiology

## 2019-04-06 ENCOUNTER — Other Ambulatory Visit: Payer: Self-pay | Admitting: Cardiology

## 2019-04-22 ENCOUNTER — Other Ambulatory Visit: Payer: Self-pay | Admitting: Cardiology

## 2019-05-08 ENCOUNTER — Other Ambulatory Visit: Payer: Self-pay

## 2019-05-08 ENCOUNTER — Ambulatory Visit (INDEPENDENT_AMBULATORY_CARE_PROVIDER_SITE_OTHER): Payer: Medicare Other | Admitting: Cardiology

## 2019-05-08 ENCOUNTER — Encounter: Payer: Self-pay | Admitting: Cardiology

## 2019-05-08 VITALS — BP 130/88 | HR 42 | Ht 64.0 in | Wt 150.0 lb

## 2019-05-08 DIAGNOSIS — R0602 Shortness of breath: Secondary | ICD-10-CM

## 2019-05-08 DIAGNOSIS — I2510 Atherosclerotic heart disease of native coronary artery without angina pectoris: Secondary | ICD-10-CM

## 2019-05-08 DIAGNOSIS — I340 Nonrheumatic mitral (valve) insufficiency: Secondary | ICD-10-CM | POA: Diagnosis not present

## 2019-05-08 DIAGNOSIS — I4811 Longstanding persistent atrial fibrillation: Secondary | ICD-10-CM | POA: Diagnosis not present

## 2019-05-08 NOTE — Patient Instructions (Signed)
Medication Instructions:  Your physician recommends that you continue on your current medications as directed. Please refer to the Current Medication list given to you today.  If you need a refill on your cardiac medications before your next appointment, please call your pharmacy.   Lab work: Art gallery manager, Magnesium,  If you have labs (blood work) drawn today and your tests are completely normal, you will receive your results only by: Marland Kitchen MyChart Message (if you have MyChart) OR . A paper copy in the mail If you have any lab test that is abnormal or we need to change your treatment, we will call you to review the results.  Testing/Procedures: Your physician has requested that you have an echocardiogram. Echocardiography is a painless test that uses sound waves to create images of your heart. It provides your doctor with information about the size and shape of your heart and how well your heart's chambers and valves are working. This procedure takes approximately one hour. There are no restrictions for this procedure.    Follow-Up: At [facility], you and your health needs are our priority.  As part of our continuing mission to provide you with exceptional heart care, we have created designated Provider Care Teams.  These Care Teams include your primary Cardiologist (physician) and Advanced Practice Providers (APPs -  Physician Assistants and Nurse Practitioners) who all work together to provide you with the care you need, when you need it. You will need a follow up appointment in 2 months.  Please call our office 2 months in advance to schedule this appointment.  You may see Shirlee More, MD or another member of our [facility] Provider Team in [place]: Jenne Campus, MD . Jyl Heinz, MD  Any Other Special Instructions Will Be Listed Below (If Applicable).

## 2019-05-08 NOTE — Progress Notes (Signed)
Cardiology Office Note:    Date:  05/08/2019   ID:  Julie FujitaRuby H Kemp, DOB 03-05-1941, MRN [id number]  PCP:  Hadley Penobbins, Robert A, MD  Cardiologist:  Norman HerrlichBrian Munley, MD  Electrophysiologist:  None   Referring MD: Hadley Penobbins, Robert A, MD   Chief Complaint  Patient presents with  . Palpitations    History of Present Illness:    Julie Kemp is a 78 y.o. female with a hx of atrial fibrillation on Coumadin, CAD, OSA on CPAP did see Dr. Dulce SellarMunley via telemedicine at January 02, 2019.  At the conclusion of her visit, she was advice to continue on her sotalol. It was thought that her shortness of breath was likely due to her bronchospasm.   Today she reports that she has been experiencing some intermittent shortness of breath and notes some palpitations.   Past Medical History:  Diagnosis Date  . Asthma   . Atrial fibrillation (HCC)    on coumadin/  . CAD (coronary artery disease) 12/21/12   This is a normal carotid duplex Doppler evaluation  . Coronary artery disease 03/08/2013   The cavity size was normal . Wall thickness was increased in a pattern of mild LVH . Systolic function was normal . The estimated ejection fraction of 55%to60% . Wall motion was normal . There were no regional  wall motion abnormalities.   . Eczema   . Heart murmur    office visit 03/01/2013  . Heart murmur 10/11/2001   Normal myocardial perfushion study.No significant ischemia demonstrated. This is a low risk scan  . Unsteady gait     Past Surgical History:  Procedure Laterality Date  . ABDOMINAL HYSTERECTOMY    . Left femoral hernia repair    . lymph nodes     left groin lymph node biopsies for benign reasons  . RECTOCELE REPAIR      Current Medications: Current Meds  Medication Sig  . BLACK ELDERBERRY PO Take by mouth daily.  . Calcium Carbonate-Vit D-Min (CALCIUM 1200 PO) Take 1 tablet by mouth 2 (two) times daily.  Marland Kitchen. ezetimibe (ZETIA) 10 MG tablet Take 10 mg by mouth daily.  . fenofibrate 160 MG  tablet TAKE 1 TABLET BY MOUTH  DAILY  . FERREX 150 150 MG capsule TAKE 1 CAPSULE BY MOUTH EVERY DAY  . montelukast (SINGULAIR) 10 MG tablet Take 1 tablet (10 mg total) by mouth at bedtime.  . Multiple Vitamins-Minerals (MULTIVITAMIN WOMEN 50+ PO) Take by mouth daily.  . Omega-3 Fatty Acids (FISH OIL PO) Take 1 capsule by mouth 3 (three) times daily. Taking Nature Bounty 1200 mg  . OVER THE COUNTER MEDICATION Take 1 capsule by mouth daily. EPICOR  . pantoprazole (PROTONIX) 40 MG tablet TAKE 1 TABLET BY MOUTH  DAILY  . sotalol (BETAPACE) 80 MG tablet TAKE 1 TABLET BY MOUTH  TWICE DAILY (Patient taking differently: Take 80 mg by mouth 3 (three) times daily. )  . warfarin (COUMADIN) 2 MG tablet Take 2 mg by mouth as directed. Currently alternating 2mg /2.5mg   . warfarin (COUMADIN) 2.5 MG tablet Take 2.5 mg by mouth as directed. Currently alternating 2mg /2.5mg   . [DISCONTINUED] polyethylene glycol (MIRALAX / GLYCOLAX) packet Take 17 g by mouth daily.     Allergies:   Zithromax [azithromycin], Amoxicillin, and Sulfa antibiotics   Social History   Socioeconomic History  . Marital status: Married    Spouse name: Not on file  . Number of children: 3  . Years of education: 9 th  .  Highest education level: Not on file  Occupational History    Comment: retired  Engineer, production  . Financial resource strain: Not on file  . Food insecurity    Worry: Not on file    Inability: Not on file  . Transportation needs    Medical: Not on file    Non-medical: Not on file  Tobacco Use  . Smoking status: Never Smoker  . Smokeless tobacco: Never Used  Substance and Sexual Activity  . Alcohol use: No    Alcohol/week: 0.0 standard drinks  . Drug use: No  . Sexual activity: Not on file  Lifestyle  . Physical activity    Days per week: Not on file    Minutes per session: Not on file  . Stress: Not on file  Relationships  . Social Musician on phone: Not on file    Gets together: Not on file     Attends religious service: Not on file    Active member of club or organization: Not on file    Attends meetings of clubs or organizations: Not on file    Relationship status: Not on file  Other Topics Concern  . Not on file  Social History Narrative   Patient lives at home alone. Patient is widowed.   Retired.   Education 9th grade   Right handed.   Caffeine - None     Family History: The patient's family history includes Asthma in her mother; Cancer in her sister; Diabetes in her father; Heart failure in her father and mother.  ROS:   Review of Systems  Constitution: Negative for decreased appetite, fever and weight gain.  HENT: Negative for congestion, ear discharge, hoarse voice and sore throat.   Eyes: Negative for discharge, redness, vision loss in right eye and visual halos.  Cardiovascular: Negative for chest pain, dyspnea on exertion, leg swelling, orthopnea and palpitations.  Respiratory: Negative for cough, hemoptysis, shortness of breath and snoring.   Endocrine: Negative for heat intolerance and polyphagia.  Hematologic/Lymphatic: Negative for bleeding problem. Does not bruise/bleed easily.  Skin: Negative for flushing, nail changes, rash and suspicious lesions.  Musculoskeletal: Negative for arthritis, joint pain, muscle cramps, myalgias, neck pain and stiffness.  Gastrointestinal: Negative for abdominal pain, bowel incontinence, diarrhea and excessive appetite.  Genitourinary: Negative for decreased libido, genital sores and incomplete emptying.  Neurological: Negative for brief paralysis, focal weakness, headaches and loss of balance.  Psychiatric/Behavioral: Negative for altered mental status, depression and suicidal ideas.  Allergic/Immunologic: Negative for HIV exposure and persistent infections.    EKGs/Labs/Other Studies Reviewed:    The following studies were reviewed today:   EKG:  The ekg ordered today demonstrates sinus rhythm, heart rate 63 bpm  comparison with ecg done at [facility] [place] no significant change.  Recent Labs: No results found for requested labs within last 8760 hours.  Recent Lipid Panel    Component Value Date/Time   CHOL 167 06/01/2016 0900   TRIG 107 06/01/2016 0900   HDL 54 06/01/2016 0900   CHOLHDL 3.1 06/01/2016 0900   VLDL 21 06/01/2016 0900   LDLCALC 92 06/01/2016 0900    Physical Exam:    VS:  BP 130/88 (BP Location: Left Arm, Patient Position: Sitting, Cuff Size: Normal)   Pulse (!) 42   Ht  (1.626 m)   Wt 150 lb (68 kg)   SpO2 94%   BMI 25.75 kg/m     Wt Readings from Last 3  Encounters:  05/08/19 150 lb (68 kg)  01/02/19 146 lb (66.2 kg)  12/13/18 144 lb 6.4 oz (65.5 kg)     GEN: Well nourished, well developed in no acute distress HEENT: Normal NECK: No JVD; No carotid bruits LYMPHATICS: No lymphadenopathy CARDIAC: S1S2 noted,RRR, no murmurs, rubs, gallops RESPIRATORY:  Clear to auscultation without rales, wheezing or rhonchi  ABDOMEN: Soft, non-tender, non-distended, +bowel sounds, no guarding. EXTREMITIES: No edema, No cyanosis, no clubbing MUSCULOSKELETAL:  No edema; No deformity  SKIN: Warm and dry NEUROLOGIC:  Alert and oriented x 3, non-focal PSYCHIATRIC:  Normal affect, good insight  ASSESSMENT:    1. Shortness of breath   2. Longstanding persistent atrial fibrillation ([place])   3. Coronary artery disease involving native heart without angina pectoris, unspecified vessel or lesion type   4. Mitral valve insufficiency, unspecified etiology    PLAN:     In order of problems listed above:  1.  Echocardiogram were previously ordered which also help [place] to identify if there is any wall motion abnormalities  We will schedule patient for this today.    2.  If symptoms persist we will pursue ischemic evaluation. In addition she prefers to discuss this further with Dr.Munley. Ideally she will be a good candidate for CTA of the coronaries.  3.  she is in sinus  rhythm today.  We will continue her on her sotalol as well as her other medication regimen.  I will do blood work today to check on her renal function.  The patient is in agreement with the above plan. The patient left the office in stable condition.  The patient will follow up in 2 months with Dr. Bettina Gavia.    Medication Adjustments/Labs and Tests Ordered: Current medicines are reviewed at length with the patient today.  Concerns regarding medicines are outlined above.  Orders Placed This Encounter  Procedures  . Magnesium  . Basic Metabolic Panel (BMET)  . EKG 12-Lead   No orders of the defined types were placed in this encounter.   Patient Instructions  Medication Instructions:  Your physician recommends that you continue on your current medications as directed. Please refer to the Current Medication list given to you today.  If you need a refill on your cardiac medications before your next appointment, please call your pharmacy.   Lab work: Art gallery manager, Magnesium,  If you have labs (blood work) drawn today and your tests are completely normal, you will receive your results only by: Marland Kitchen MyChart Message (if you have MyChart) OR . A paper copy in the mail If you have any lab test that is abnormal or we need to change your treatment, we will call you to review the results.  Testing/Procedures: Your physician has requested that you have an echocardiogram. Echocardiography is a painless test that uses sound waves to create images of your heart. It provides your doctor with information about the size and shape of your heart and how well your heart's chambers and valves are working. This procedure takes approximately one hour. There are no restrictions for this procedure.    Follow-Up: At [facility], you and your health needs are our priority.  As part of our continuing mission to provide you with exceptional heart care, we have created designated Provider Care Teams.  These Care Teams  include your primary Cardiologist (physician) and Advanced Practice Providers (APPs -  Physician Assistants and Nurse Practitioners) who all work together to provide you with the care you need, when you need  it. You will need a follow up appointment in 2 months.  Please call our office 2 months in advance to schedule this appointment.  You may see Norman Herrlich, MD or another member of our [facility] Provider Team in [place]: Gypsy Balsam, MD . Belva Crome, MD  Any Other Special Instructions Will Be Listed Below (If Applicable).       Adopting a Healthy Lifestyle.  Know what a healthy weight is for you (roughly BMI <25) and aim to maintain this   Aim for 7+ servings of fruits and vegetables daily   65-80+ fluid ounces of water or unsweet tea for healthy kidneys   Limit to max 1 drink of alcohol per day; avoid smoking/tobacco   Limit animal fats in diet for cholesterol and heart health - choose grass fed whenever available   Avoid highly processed foods, and foods high in saturated/trans fats   Aim for low stress - take time to unwind and care for your mental health   Aim for 150 min of moderate intensity exercise weekly for heart health, and weights twice weekly for bone health   Aim for 7-9 hours of sleep daily   When it comes to diets, agreement about the perfect plan isnt easy to find, even among the experts. Experts at the [facility] of [employer] developed an idea known as the Healthy Eating Plate. Just imagine a plate divided into logical, healthy portions.   The emphasis is on diet quality:   Load up on vegetables and fruits - one-half of your plate: Aim for color and variety, and remember that potatoes dont count.   Go for whole grains - one-quarter of your plate: Whole wheat, barley, wheat berries, quinoa, oats, brown rice, and foods made with them. If you want pasta, go with whole wheat pasta.   Protein power - one-quarter of your plate: Fish,  chicken, beans, and nuts are all healthy, versatile protein sources. Limit red meat.   The diet, however, does go beyond the plate, offering a few other suggestions.   Use healthy plant oils, such as olive, canola, soy, corn, sunflower and peanut. Check the labels, and avoid partially hydrogenated oil, which have unhealthy trans fats.   If youre thirsty, drink water. Coffee and tea are good in moderation, but skip sugary drinks and limit milk and dairy products to one or two daily servings.   The type of carbohydrate in the diet is more important than the amount. Some sources of carbohydrates, such as vegetables, fruits, whole grains, and beans-are healthier than others.   Finally, stay active  Signed, Thomasene Ripple, DO  05/08/2019 10:32 PM    [facility]

## 2019-05-09 LAB — BASIC METABOLIC PANEL
BUN/Creatinine Ratio: 25 (ref 12–28)
BUN: 23 mg/dL (ref 8–27)
CO2: 28 mmol/L (ref 20–29)
Calcium: 11.1 mg/dL — ABNORMAL HIGH (ref 8.7–10.3)
Chloride: 103 mmol/L (ref 96–106)
Creatinine, Ser: 0.92 mg/dL (ref 0.57–1.00)
GFR calc Af Amer: 69 mL/min/{1.73_m2} (ref >59)
GFR calc non Af Amer: 60 mL/min/{1.73_m2} (ref >59)
Glucose: 110 mg/dL — ABNORMAL HIGH (ref 65–99)
Potassium: 4.5 mmol/L (ref 3.5–5.2)
Sodium: 142 mmol/L (ref 134–144)

## 2019-05-09 LAB — MAGNESIUM: Magnesium: 2.1 mg/dL (ref 1.6–2.3)

## 2019-06-07 NOTE — Progress Notes (Signed)
Cardiology Office Note:    Date:  06/08/2019   ID:  Julie, Kemp 09/23/1940, MRN [id number]  PCP:  Myrlene Broker, MD  Cardiologist:  Shirlee More, MD    Referring MD: Myrlene Broker, MD    ASSESSMENT:    1. PAF (paroxysmal atrial fibrillation) ([place])   2. Chronic anticoagulation   3. High risk medication use   4. Hypertriglyceridemia    PLAN:    In order of problems listed above:  1. Stable I like to see her take 240 mg of sotalol day I asked her to take it twice daily she does not want to change them with a normal QT interval I think is reasonable to do what she is doing.  I asked her to screen heart rate and blood pressure at home and tell me she is having bradycardia less than 50/min and she will continue her current anticoagulant warfarin at her request 2. Continue warfarin moderate stroke risk 3. Continue sotalol well-tolerated normal QT interval 4. Continue fenofibrate fish oil check CMP lipid profile and she also takes Zetia.   Next appointment: 6 months   Medication Adjustments/Labs and Tests Ordered: Current medicines are reviewed at length with the patient today.  Concerns regarding medicines are outlined above.  Orders Placed This Encounter  Procedures  . Lipid Profile  . Comp Met (CMET)  . EKG 12-Lead   No orders of the defined types were placed in this encounter.   Chief Complaint  Patient presents with  . Follow-up  . Atrial Fibrillation    History of Present Illness:    Julie Kemp is a 78 y.o. female with a hx of paroxysmal atrial fibrillation on Sotolol and  Coumadin, asthma, OSA on CPAP last seen by Dr Harriet Masson 05/08/2019. Compliance with diet, lifestyle and medications: Yes  Previously taking 200 mg a day of sotalol.  Having breakthrough episodes of rapid heart rhythm with shortness of breath increased to 80 mg 3 times a day and has had no further recurrence.  Her asthma is stable she has no wheezing and no cough and she has no  complaints of edema shortness of breath orthopnea chest pain recurrent palpitations syncope or bleeding from her anticoagulant.  Her EKG in my office today shows sinus bradycardia 57 bpm and a normal QT interval corrected 457 ms. Past Medical History:  Diagnosis Date  . Asthma   . Atrial fibrillation ([place])    on coumadin/  . CAD (coronary artery disease) 12/21/12   This is a normal carotid duplex Doppler evaluation  . Coronary artery disease 03/08/2013   The cavity size was normal . Wall thickness was increased in a pattern of mild LVH . Systolic function was normal . The estimated ejection fraction of 55%to60% . Wall motion was normal . There were no regional  wall motion abnormalities.   . Eczema   . Heart murmur    office visit 03/01/2013  . Heart murmur 10/11/2001   Normal myocardial perfushion study.No significant ischemia demonstrated. This is a low risk scan  . Unsteady gait     Past Surgical History:  Procedure Laterality Date  . ABDOMINAL HYSTERECTOMY    . Left femoral hernia repair    . lymph nodes     left groin lymph node biopsies for benign reasons  . RECTOCELE REPAIR      Current Medications: Current Meds  Medication Sig  . BLACK ELDERBERRY PO Take by mouth daily.  . Calcium Carbonate-Vit D-Min (  CALCIUM 1200 PO) Take 1 tablet by mouth 2 (two) times daily.  Marland Kitchen ezetimibe (ZETIA) 10 MG tablet Take 10 mg by mouth daily.  . fenofibrate 160 MG tablet TAKE 1 TABLET BY MOUTH  DAILY  . FERREX 150 150 MG capsule TAKE 1 CAPSULE BY MOUTH EVERY DAY  . montelukast (SINGULAIR) 10 MG tablet Take 1 tablet (10 mg total) by mouth at bedtime.  . Multiple Vitamins-Minerals (MULTIVITAMIN WOMEN 50+ PO) Take by mouth daily.  . Omega-3 Fatty Acids (FISH OIL PO) Take 1 capsule by mouth 3 (three) times daily. Taking Nature Bounty 1200 mg  . OVER THE COUNTER MEDICATION Take 1 capsule by mouth daily. EPICOR  . pantoprazole (PROTONIX) 40 MG tablet TAKE 1 TABLET BY MOUTH  DAILY  . sotalol  (BETAPACE) 80 MG tablet Take 80 mg by mouth 3 (three) times daily.  Marland Kitchen warfarin (COUMADIN) 2 MG tablet Take 2 mg by mouth as directed. Currently alternating '2mg'$ /2.'5mg'$   . warfarin (COUMADIN) 2.5 MG tablet Take 2.5 mg by mouth as directed. Currently alternating '2mg'$ /2.'5mg'$      Allergies:   Zithromax [azithromycin], Amoxicillin, and Sulfa antibiotics   Social History   Socioeconomic History  . Marital status: Married    Spouse name: Not on file  . Number of children: 3  . Years of education: 9 th  . Highest education level: Not on file  Occupational History    Comment: retired  Scientific laboratory technician  . Financial resource strain: Not on file  . Food insecurity    Worry: Not on file    Inability: Not on file  . Transportation needs    Medical: Not on file    Non-medical: Not on file  Tobacco Use  . Smoking status: Never Smoker  . Smokeless tobacco: Never Used  Substance and Sexual Activity  . Alcohol use: No    Alcohol/week: 0.0 standard drinks  . Drug use: No  . Sexual activity: Not on file  Lifestyle  . Physical activity    Days per week: Not on file    Minutes per session: Not on file  . Stress: Not on file  Relationships  . Social Herbalist on phone: Not on file    Gets together: Not on file    Attends religious service: Not on file    Active member of club or organization: Not on file    Attends meetings of clubs or organizations: Not on file    Relationship status: Not on file  Other Topics Concern  . Not on file  Social History Narrative   Patient lives at home alone. Patient is widowed.   Retired.   Education 9th grade   Right handed.   Caffeine - None     Family History: The patient's family history includes Asthma in her mother; Cancer in her sister; Diabetes in her father; Heart failure in her father and mother. ROS:   Please see the history of present illness.    All other systems reviewed and are negative.  EKGs/Labs/Other Studies Reviewed:     The following studies were reviewed today:  EKG:  EKG ordered today and personally reviewed.  The ekg ordered today demonstrates sinus bradycardia 57 bpm 1 APC otherwise normal  Recent Labs: 05/08/2019: BUN 23; Creatinine, Ser 0.92; Magnesium 2.1; Potassium 4.5; Sodium 142  Recent Lipid Panel    Component Value Date/Time   CHOL 167 06/01/2016 0900   TRIG 107 06/01/2016 0900   HDL 54 06/01/2016 0900  CHOLHDL 3.1 06/01/2016 0900   VLDL 21 06/01/2016 0900   LDLCALC 92 06/01/2016 0900    Physical Exam:    VS:  BP 132/80 (BP Location: Left Arm, Patient Position: Sitting, Cuff Size: Normal)   Pulse (!) 57   Ht '5\' 4"'$  (1.626 m)   Wt 144 lb 12.8 oz (65.7 kg)   SpO2 92%   BMI 24.85 kg/m     Wt Readings from Last 3 Encounters:  06/08/19 144 lb 12.8 oz (65.7 kg)  05/08/19 150 lb (68 kg)  01/02/19 146 lb (66.2 kg)     GEN:  Well nourished, well developed in no acute distress HEENT: Normal NECK: No JVD; No carotid bruits LYMPHATICS: No lymphadenopathy CARDIAC: RRR, no murmurs, rubs, gallops RESPIRATORY:  Clear to auscultation without rales, wheezing or rhonchi  ABDOMEN: Soft, non-tender, non-distended MUSCULOSKELETAL:  No edema; No deformity  SKIN: Warm and dry NEUROLOGIC:  Alert and oriented x 3 PSYCHIATRIC:  Normal affect    Signed, Shirlee More, MD  06/08/2019 12:23 PM    [facility]

## 2019-06-08 ENCOUNTER — Encounter: Payer: Self-pay | Admitting: Cardiology

## 2019-06-08 ENCOUNTER — Other Ambulatory Visit: Payer: Self-pay

## 2019-06-08 ENCOUNTER — Ambulatory Visit (INDEPENDENT_AMBULATORY_CARE_PROVIDER_SITE_OTHER): Payer: Medicare Other | Admitting: Cardiology

## 2019-06-08 VITALS — BP 132/80 | HR 57 | Ht 64.0 in | Wt 144.8 lb

## 2019-06-08 DIAGNOSIS — E781 Pure hyperglyceridemia: Secondary | ICD-10-CM

## 2019-06-08 DIAGNOSIS — Z79899 Other long term (current) drug therapy: Secondary | ICD-10-CM

## 2019-06-08 DIAGNOSIS — Z7901 Long term (current) use of anticoagulants: Secondary | ICD-10-CM | POA: Diagnosis not present

## 2019-06-08 DIAGNOSIS — I480 Paroxysmal atrial fibrillation: Secondary | ICD-10-CM | POA: Diagnosis not present

## 2019-06-08 NOTE — Patient Instructions (Addendum)
Medication Instructions:  Your physician recommends that you continue on your current medications as directed. Please refer to the Current Medication list given to you today.  *If you need a refill on your cardiac medications before your next appointment, please call your pharmacy*  Lab Work: Your physician recommends that you return for lab work today: CMP, lipid panel.  If you have labs (blood work) drawn today and your tests are completely normal, you will receive your results only by: Marland Kitchen MyChart Message (if you have MyChart) OR . A paper copy in the mail If you have any lab test that is abnormal or we need to change your treatment, we will call you to review the results.  Testing/Procedures: None  Follow-Up: At [facility], you and your health needs are our priority.  As part of our continuing mission to provide you with exceptional heart care, we have created designated Provider Care Teams.  These Care Teams include your primary Cardiologist (physician) and Advanced Practice Providers (APPs -  Physician Assistants and Nurse Practitioners) who all work together to provide you with the care you need, when you need it.  Your next appointment:   6 months  The format for your next appointment:   In Person  Provider:   Shirlee More, MD  **Check your heart rate and blood pressure daily at home. Call our office if your heart rate is consistently below 50.

## 2019-06-09 LAB — COMPREHENSIVE METABOLIC PANEL
ALT: 19 IU/L (ref 0–32)
AST: 21 IU/L (ref 0–40)
Albumin/Globulin Ratio: 2 (ref 1.2–2.2)
Albumin: 4.3 g/dL (ref 3.7–4.7)
Alkaline Phosphatase: 51 IU/L (ref 39–117)
BUN/Creatinine Ratio: 27 (ref 12–28)
BUN: 25 mg/dL (ref 8–27)
Bilirubin Total: 0.2 mg/dL (ref 0.0–1.2)
CO2: 24 mmol/L (ref 20–29)
Calcium: 10.7 mg/dL — ABNORMAL HIGH (ref 8.7–10.3)
Chloride: 103 mmol/L (ref 96–106)
Creatinine, Ser: 0.92 mg/dL (ref 0.57–1.00)
GFR calc Af Amer: 69 mL/min/{1.73_m2} (ref >59)
GFR calc non Af Amer: 60 mL/min/{1.73_m2} (ref >59)
Globulin, Total: 2.1 g/dL (ref 1.5–4.5)
Glucose: 115 mg/dL — ABNORMAL HIGH (ref 65–99)
Potassium: 5.7 mmol/L — ABNORMAL HIGH (ref 3.5–5.2)
Sodium: 139 mmol/L (ref 134–144)
Total Protein: 6.4 g/dL (ref 6.0–8.5)

## 2019-06-09 LAB — LIPID PANEL
Chol/HDL Ratio: 4.9 ratio — ABNORMAL HIGH (ref 0.0–4.4)
Cholesterol, Total: 219 mg/dL — ABNORMAL HIGH (ref 100–199)
HDL: 45 mg/dL (ref >39)
LDL Chol Calc (NIH): 139 mg/dL — ABNORMAL HIGH (ref 0–99)
Triglycerides: 194 mg/dL — ABNORMAL HIGH (ref 0–149)
VLDL Cholesterol Cal: 35 mg/dL (ref 5–40)

## 2019-06-11 ENCOUNTER — Other Ambulatory Visit: Payer: Self-pay | Admitting: Cardiology

## 2019-06-12 ENCOUNTER — Ambulatory Visit (INDEPENDENT_AMBULATORY_CARE_PROVIDER_SITE_OTHER): Payer: Medicare Other

## 2019-06-12 ENCOUNTER — Other Ambulatory Visit: Payer: Self-pay

## 2019-06-12 ENCOUNTER — Telehealth: Payer: Self-pay | Admitting: *Deleted

## 2019-06-12 DIAGNOSIS — I480 Paroxysmal atrial fibrillation: Secondary | ICD-10-CM

## 2019-06-12 DIAGNOSIS — E875 Hyperkalemia: Secondary | ICD-10-CM

## 2019-06-12 DIAGNOSIS — R0600 Dyspnea, unspecified: Secondary | ICD-10-CM

## 2019-06-12 NOTE — Telephone Encounter (Signed)
-----   Message from Richardo Priest, MD sent at 06/09/2019 10:57 AM EST ----- Normal or stable result  Lipids good need to recheck BMP

## 2019-06-12 NOTE — Progress Notes (Signed)
Complete echocardiogram has been perormed.  Jimmy Kaipo Ardis RDCS, RVT 

## 2019-06-12 NOTE — Telephone Encounter (Signed)
Last office note with med listed as 80mg  TID. Refill request for 80mg  BID. Please confirm with patient what dose she is taking.

## 2019-06-12 NOTE — Telephone Encounter (Signed)
Patient informed of lab results and advised to return to our office this week to recheck a BMP due to hyperkalemia. Patient is agreeable and will return to our office this afternoon for repeat labs. Order placed. Patient verbalized understanding. No further questions.

## 2019-06-13 ENCOUNTER — Other Ambulatory Visit: Payer: Self-pay | Admitting: Cardiology

## 2019-06-13 LAB — BASIC METABOLIC PANEL
BUN/Creatinine Ratio: 21 (ref 12–28)
BUN: 24 mg/dL (ref 8–27)
CO2: 24 mmol/L (ref 20–29)
Calcium: 10.3 mg/dL (ref 8.7–10.3)
Chloride: 101 mmol/L (ref 96–106)
Creatinine, Ser: 1.13 mg/dL — ABNORMAL HIGH (ref 0.57–1.00)
GFR calc Af Amer: 54 mL/min/{1.73_m2} — ABNORMAL LOW (ref >59)
GFR calc non Af Amer: 47 mL/min/{1.73_m2} — ABNORMAL LOW (ref >59)
Glucose: 123 mg/dL — ABNORMAL HIGH (ref 65–99)
Potassium: 4.5 mmol/L (ref 3.5–5.2)
Sodium: 142 mmol/L (ref 134–144)

## 2019-06-13 NOTE — Telephone Encounter (Signed)
Phoned and spoke with patient, confirmed that she is taking Sotalol 80 mg TID. Rx updated and sent to pharmacy.

## 2019-06-13 NOTE — Telephone Encounter (Signed)
Left message for pt to call us back and confirm how she is taking her Sotalol 80 mg/bid or tid

## 2019-06-15 ENCOUNTER — Encounter: Payer: Self-pay | Admitting: *Deleted

## 2019-08-07 ENCOUNTER — Other Ambulatory Visit: Payer: Self-pay | Admitting: Allergy

## 2019-08-24 ENCOUNTER — Other Ambulatory Visit: Payer: Self-pay | Admitting: Allergy

## 2019-08-31 ENCOUNTER — Other Ambulatory Visit: Payer: Self-pay | Admitting: Cardiology

## 2019-10-23 ENCOUNTER — Other Ambulatory Visit: Payer: Self-pay | Admitting: Cardiology

## 2020-01-11 ENCOUNTER — Ambulatory Visit: Payer: Medicare Other | Admitting: Cardiology

## 2020-02-09 ENCOUNTER — Other Ambulatory Visit: Payer: Self-pay | Admitting: Cardiology

## 2020-02-18 NOTE — Progress Notes (Signed)
Cardiology Office Note:    Date:  02/19/2020   ID:  Julie Kemp, Julie Kemp 09-15-1940, MRN [id number]  PCP:  Hadley Pen, MD  Cardiologist:  Norman Herrlich, MD    Referring MD: Hadley Pen, MD    ASSESSMENT:    1. PAF (paroxysmal atrial fibrillation) (HCC)   2. Chronic anticoagulation   3. High risk medication use    PLAN:    In order of problems listed above:  1. Better control with an adequate dose of sotalol continue same along with warfarin INR goal 2.5. 2. EKG shows no evidence of proarrhythmia or toxicity from sotalol   Next appointment: 6 months   Medication Adjustments/Labs and Tests Ordered: Current medicines are reviewed at length with the patient today.  Concerns regarding medicines are outlined above.  No orders of the defined types were placed in this encounter.  No orders of the defined types were placed in this encounter.   Chief Complaint  Patient presents with  . Follow-up    On sotalol  . Atrial Fibrillation    History of Present Illness:    Julie Kemp is a 79 y.o. female with a hx of paroxysmal atrial fibrillation on sotalol and anticoagulated with Coumadin obstructive sleep apnea on CPAP and asthma.  She was last seen 06/07/2020. Compliance with diet, lifestyle and medications: Yes  She is having more trouble with shortness of breath especially in hot humid air and poor quality.  Since taking sotalol 3 times a day she has had no rapid rhythms.  No chest pain edema palpitation or syncope.  Tolerates her anticoagulant without bleeding. Past Medical History:  Diagnosis Date  . Asthma   . Atrial fibrillation (HCC)    on coumadin/  . CAD (coronary artery disease) 12/21/12   This is a normal carotid duplex Doppler evaluation  . Coronary artery disease 03/08/2013   The cavity size was normal . Wall thickness was increased in a pattern of mild LVH . Systolic function was normal . The estimated ejection fraction of 55%to60% . Wall motion  was normal . There were no regional  wall motion abnormalities.   . Eczema   . Heart murmur    office visit 03/01/2013  . Heart murmur 10/11/2001   Normal myocardial perfushion study.No significant ischemia demonstrated. This is a low risk scan  . Unsteady gait     Past Surgical History:  Procedure Laterality Date  . ABDOMINAL HYSTERECTOMY    . Left femoral hernia repair    . lymph nodes     left groin lymph node biopsies for benign reasons  . RECTOCELE REPAIR      Current Medications: Current Meds  Medication Sig  . BLACK ELDERBERRY PO Take by mouth daily.  . Calcium Carbonate-Vit D-Min (CALCIUM 1200 PO) Take 1 tablet by mouth 2 (two) times daily.  Marland Kitchen ezetimibe (ZETIA) 10 MG tablet Take 10 mg by mouth daily.  . fenofibrate 160 MG tablet TAKE 1 TABLET BY MOUTH  DAILY  . FERREX 150 150 MG capsule TAKE 1 CAPSULE BY MOUTH EVERY DAY  . montelukast (SINGULAIR) 10 MG tablet TAKE ONE TABLET BY MOUTH ONCE DAILY AT BEDTIME  . Multiple Vitamins-Minerals (MULTIVITAMIN WOMEN 50+ PO) Take by mouth daily.  . Omega-3 Fatty Acids (FISH OIL PO) Take 1 capsule by mouth 3 (three) times daily. Taking Nature Bounty 1200 mg  . OVER THE COUNTER MEDICATION Take 1 capsule by mouth daily. EPICOR  . pantoprazole (PROTONIX) 40 MG tablet  TAKE 1 TABLET BY MOUTH  DAILY  . sotalol (BETAPACE) 80 MG tablet TAKE 1 TABLET BY MOUTH 3  TIMES DAILY  . warfarin (COUMADIN) 2 MG tablet Take 2 mg by mouth as directed. Currently alternating 2mg /2.5mg   . warfarin (COUMADIN) 2.5 MG tablet Take 2.5 mg by mouth as directed. Currently alternating 2mg /2.5mg      Allergies:   Zithromax [azithromycin], Amoxicillin, and Sulfa antibiotics   Social History   Socioeconomic History  . Marital status: Married    Spouse name: Not on file  . Number of children: 3  . Years of education: 9 th  . Highest education level: Not on file  Occupational History    Comment: retired  Tobacco Use  . Smoking status: Never Smoker  . Smokeless  tobacco: Never Used  Vaping Use  . Vaping Use: Never used  Substance and Sexual Activity  . Alcohol use: No    Alcohol/week: 0.0 standard drinks  . Drug use: No  . Sexual activity: Not on file  Other Topics Concern  . Not on file  Social History Narrative   Patient lives at home alone. Patient is widowed.   Retired.   Education 9th grade   Right handed.   Caffeine - None   Social Determinants of Health   Financial Resource Strain:   . Difficulty of Paying Living Expenses:   Food Insecurity:   . Worried About in the Last Year:   . in the Last Year:   Transportation Needs:   . Programme researcher, broadcasting/film/video (Medical):   Barista Lack of Transportation (Non-Medical):   Physical Activity:   . Days of Exercise per Week:   . Minutes of Exercise per Session:   Stress:   . Feeling of Stress :   Social Connections:   . Frequency of Communication with Friends and Family:   . Frequency of Social Gatherings with Friends and Family:   . Attends Religious Services:   . Active Member of Clubs or Organizations:   . Attends Freight forwarder Meetings:   Marland Kitchen Marital Status:      Family History: The patient's family history includes Asthma in her mother; Cancer in her sister; Diabetes in her father; Heart failure in her father and mother. ROS:   Please see the history of present illness.    All other systems reviewed and are negative.  EKGs/Labs/Other Studies Reviewed:    The following studies were reviewed today:  EKG:  EKG ordered today and personally reviewed.  The ekg ordered today demonstrates sinus rhythm normal PR interval normal QT interval minor nonspecific ST abnormality single PVCs.  Recent Labs:  02/15/2020 INR 2.6, CMP potassium 5.3 GFR 47 cc normal liver function 05/08/2019: Magnesium 2.1 06/08/2019: ALT 19 06/12/2019: BUN 24; Creatinine, Ser 1.13; Potassium 4.5; Sodium 142  Recent Lipid Panel    Component Value Date/Time   CHOL 219 (H)  06/08/2019 1156   TRIG 194 (H) 06/08/2019 1156   HDL 45 06/08/2019 1156   CHOLHDL 4.9 (H) 06/08/2019 1156   CHOLHDL 3.1 06/01/2016 0900   VLDL 21 06/01/2016 0900   LDLCALC 139 (H) 06/08/2019 1156    Physical Exam:    VS:  BP 112/70 (BP Location: Left Arm, Patient Position: Sitting, Cuff Size: Normal)   Pulse 72   Ht 5\' 4"  (1.626 m)   Wt 139 lb 9.6 oz (63.3 kg)   SpO2 98%   BMI 23.96 kg/m     Wt  Readings from Last 3 Encounters:  02/19/20 139 lb 9.6 oz (63.3 kg)  06/08/19 144 lb 12.8 oz (65.7 kg)  05/08/19 150 lb (68 kg)     GEN:  Well nourished, well developed in no acute distress HEENT: Normal NECK: No JVD; No carotid bruits LYMPHATICS: No lymphadenopathy CARDIAC: RRR, no murmurs, rubs, gallops RESPIRATORY:  Clear to auscultation without rales, wheezing or rhonchi  ABDOMEN: Soft, non-tender, non-distended MUSCULOSKELETAL:  No edema; No deformity  SKIN: Warm and dry NEUROLOGIC:  Alert and oriented x 3 PSYCHIATRIC:  Normal affect    Signed, Norman Herrlich, MD  02/19/2020 1:31 PM    [facility]

## 2020-02-19 ENCOUNTER — Ambulatory Visit: Payer: Medicare Other | Admitting: Cardiology

## 2020-02-19 ENCOUNTER — Encounter: Payer: Self-pay | Admitting: Cardiology

## 2020-02-19 ENCOUNTER — Other Ambulatory Visit: Payer: Self-pay

## 2020-02-19 VITALS — BP 112/70 | HR 72 | Ht 64.0 in | Wt 139.6 lb

## 2020-02-19 DIAGNOSIS — Z7901 Long term (current) use of anticoagulants: Secondary | ICD-10-CM | POA: Diagnosis not present

## 2020-02-19 DIAGNOSIS — I480 Paroxysmal atrial fibrillation: Secondary | ICD-10-CM | POA: Diagnosis not present

## 2020-02-19 DIAGNOSIS — Z79899 Other long term (current) drug therapy: Secondary | ICD-10-CM | POA: Diagnosis not present

## 2020-02-19 NOTE — Patient Instructions (Signed)

## 2020-03-25 DIAGNOSIS — I10 Essential (primary) hypertension: Secondary | ICD-10-CM | POA: Insufficient documentation

## 2020-04-06 ENCOUNTER — Other Ambulatory Visit: Payer: Self-pay | Admitting: Cardiology

## 2020-04-06 ENCOUNTER — Other Ambulatory Visit: Payer: Self-pay | Admitting: Family

## 2020-05-04 ENCOUNTER — Other Ambulatory Visit: Payer: Self-pay | Admitting: Cardiology

## 2020-09-22 ENCOUNTER — Telehealth: Payer: Self-pay | Admitting: Cardiology

## 2020-09-22 NOTE — Telephone Encounter (Signed)
Left message for patient to return call.

## 2020-09-22 NOTE — Telephone Encounter (Signed)
Yes okay to take. 

## 2020-09-22 NOTE — Telephone Encounter (Signed)
Pt wants to know if she is able to take "Doctor's Best High Absorption CoQ10 with BioPerine" along with her medications.  Please advise- [phone]

## 2020-09-23 NOTE — Telephone Encounter (Signed)
Julie Kemp aware of the following per Dr. Dulce Sellar

## 2020-09-23 NOTE — Telephone Encounter (Signed)
Patient's daughter returning call. 

## 2020-10-21 DIAGNOSIS — L309 Dermatitis, unspecified: Secondary | ICD-10-CM | POA: Insufficient documentation

## 2020-11-12 ENCOUNTER — Other Ambulatory Visit: Payer: Self-pay

## 2020-11-12 ENCOUNTER — Encounter: Payer: Self-pay | Admitting: Cardiology

## 2020-11-12 ENCOUNTER — Ambulatory Visit: Payer: Medicare Other | Admitting: Cardiology

## 2020-11-12 VITALS — BP 128/74 | HR 54 | Ht 64.0 in | Wt 126.8 lb

## 2020-11-12 DIAGNOSIS — I480 Paroxysmal atrial fibrillation: Secondary | ICD-10-CM | POA: Diagnosis not present

## 2020-11-12 DIAGNOSIS — Z79899 Other long term (current) drug therapy: Secondary | ICD-10-CM

## 2020-11-12 DIAGNOSIS — Z7901 Long term (current) use of anticoagulants: Secondary | ICD-10-CM | POA: Diagnosis not present

## 2020-11-12 DIAGNOSIS — I10 Essential (primary) hypertension: Secondary | ICD-10-CM | POA: Diagnosis not present

## 2020-11-12 MED ORDER — SOTALOL HCL 80 MG PO TABS: ORAL_TABLET | ORAL | 2 refills | Status: AC

## 2020-11-12 NOTE — Patient Instructions (Addendum)
Medication Instructions:  Your physician has recommended you make the following change in your medication:  DECREASE: Sotalol take one tablet by mouth three times daily alternating with twice daily every other day.   *If you need a refill on your cardiac medications before your next appointment, please call your pharmacy*   Lab Work: None If you have labs (blood work) drawn today and your tests are completely normal, you will receive your results only by: Marland Kitchen MyChart Message (if you have MyChart) OR . A paper copy in the mail If you have any lab test that is abnormal or we need to change your treatment, we will call you to review the results.   Testing/Procedures: NOne   Follow-Up: At [facility], you and your health needs are our priority.  As part of our continuing mission to provide you with exceptional heart care, we have created designated Provider Care Teams.  These Care Teams include your primary Cardiologist (physician) and Advanced Practice Providers (APPs -  Physician Assistants and Nurse Practitioners) who all work together to provide you with the care you need, when you need it.  We recommend signing up for the patient portal called "MyChart".  Sign up information is provided on this After Visit Summary.  MyChart is used to connect with patients for Virtual Visits (Telemedicine).  Patients are able to view lab/test results, encounter notes, upcoming appointments, etc.  Non-urgent messages can be sent to your provider as well.   To learn more about what you can do with MyChart, go to [email or web address].    Your next appointment:   9 month(s)  The format for your next appointment:   In Person  Provider:   Norman Herrlich, MD   Other Instructions

## 2020-11-12 NOTE — Progress Notes (Signed)
Cardiology Office Note:    Date:  11/12/2020   ID:  Julie Kemp, Julie Kemp [birth year], MRN [id number]  PCP:  Hadley Pen, MD  Cardiologist:  Norman Herrlich, MD    Referring MD: Hadley Pen, MD    ASSESSMENT:    1. PAF (paroxysmal atrial fibrillation) (HCC)   2. Chronic anticoagulation   3. High risk medication use   4. Essential hypertension    PLAN:    In order of problems listed above:  1. Stable she is maintaining sinus rhythm but I think she is having side effects from sotalol.  She is relatively bradycardic she had a flare of asthma she is weak very reluctant to reduce the dose but she is going to decrease to twice daily every other day.  Next visit I think we will go to twice daily. 2. Continue anticoagulant moderate stroke risk 3. Reduced dose of sotalol 4. BP at target at present on no antihypertensive except the beta-blocker effect of her sotalol   Next appointment: 9 months   Medication Adjustments/Labs and Tests Ordered: Current medicines are reviewed at length with the patient today.  Concerns regarding medicines are outlined above.  Orders Placed This Encounter  Procedures  . EKG 12-Lead   Meds ordered this encounter  Medications  . sotalol (BETAPACE) 80 MG tablet    Sig: Take one tablet by mouth three times daily alternating with twice daily every other day    Dispense:  270 tablet    Refill:  2    Requesting 1 year supply    Chief Complaint  Patient presents with  . Follow-up  . Atrial Fibrillation    History of Present Illness:    Julie Kemp is a 80 y.o. female with a hx of  paroxysmal atrial fibrillation on sotalol and anticoagulated with Coumadin obstructive sleep apnea on CPAP and asthma  last seen 02/19/2020. Compliance with diet, lifestyle and medications: Yes  She is struggling in life since the death of her husband and she tells me she has a cerebellar problem with gait dysfunction. She had a ED visit at [facility]  with an exacerbation of asthma treated as an outpatient No rapid heart rhythms syncope chest pain shortness of breath or wheezing.  Recent INR was 3.7 10/21/2020  She is in the emergency room at [facility] 05/10/2020 for chest pain.  Laboratory studies showed a low high-sensitivity troponin and repeat without delta CBC with a hemoglobin of 12.8 creatinine 0.95 GFR 58 cc potassium 4.5 chest x-ray showed aortic atherosclerosis otherwise normal EKG showed sinus rhythm nonspecific ST and T changes.  She was discharged from the emergency room with a diagnosis of nonspecific chest pain and gastroesophageal reflux disease with a notation she was given Pepcid and GI cocktail with completely relief of chest pain in the emergency room. Past Medical History:  Diagnosis Date  . Asthma   . Atrial fibrillation (HCC)    on coumadin/  . CAD (coronary artery disease) 12/21/12   This is a normal carotid duplex Doppler evaluation  . Coronary artery disease 03/08/2013   The cavity size was normal . Wall thickness was increased in a pattern of mild LVH . Systolic function was normal . The estimated ejection fraction of 55%to60% . Wall motion was normal . There were no regional  wall motion abnormalities.   . Eczema   . Heart murmur    office visit 03/01/2013  . Heart murmur 10/11/2001   Normal myocardial  perfushion study.No significant ischemia demonstrated. This is a low risk scan  . Unsteady gait     Past Surgical History:  Procedure Laterality Date  . ABDOMINAL HYSTERECTOMY    . Left femoral hernia repair    . lymph nodes     left groin lymph node biopsies for benign reasons  . RECTOCELE REPAIR      Current Medications: Current Meds  Medication Sig  . BLACK ELDERBERRY PO Take by mouth daily.  . Calcium Carbonate-Vit D-Min (CALCIUM 1200 PO) Take 1 tablet by mouth 2 (two) times daily.  Marland Kitchen ezetimibe (ZETIA) 10 MG tablet Take 10 mg by mouth daily.  . fenofibrate 160 MG tablet TAKE 1 TABLET BY  MOUTH  DAILY  . FERREX 150 150 MG capsule TAKE 1 CAPSULE BY MOUTH EVERY DAY  . montelukast (SINGULAIR) 10 MG tablet TAKE ONE TABLET BY MOUTH ONCE DAILY AT BEDTIME  . Multiple Vitamins-Minerals (MULTIVITAMIN WOMEN 50+ PO) Take by mouth daily.  . Omega-3 Fatty Acids (FISH OIL PO) Take 1 capsule by mouth 3 (three) times daily. Taking Nature Bounty 1200 mg  . pantoprazole (PROTONIX) 40 MG tablet TAKE 1 TABLET BY MOUTH  DAILY  . warfarin (COUMADIN) 2 MG tablet Take 2 mg by mouth as directed. Currently alternating 2mg /2.5mg   . warfarin (COUMADIN) 2.5 MG tablet Take 2.5 mg by mouth as directed. Currently alternating 2mg /2.5mg   . [DISCONTINUED] sotalol (BETAPACE) 80 MG tablet TAKE 1 TABLET BY MOUTH 3  TIMES DAILY     Allergies:   Zithromax [azithromycin], Amoxicillin, and Sulfa antibiotics   Social History   Socioeconomic History  . Marital status: Married    Spouse name: Not on file  . Number of children: 3  . Years of education: 9 th  . Highest education level: Not on file  Occupational History    Comment: retired  Tobacco Use  . Smoking status: Never Smoker  . Smokeless tobacco: Never Used  Vaping Use  . Vaping Use: Never used  Substance and Sexual Activity  . Alcohol use: No    Alcohol/week: 0.0 standard drinks  . Drug use: No  . Sexual activity: Not on file  Other Topics Concern  . Not on file  Social History Narrative   Patient lives at home alone. Patient is widowed.   Retired.   Education 9th grade   Right handed.   Caffeine - None   Social Determinants of Health   Financial Resource Strain: Not on file  Food Insecurity: Not on file  Transportation Needs: Not on file  Physical Activity: Not on file  Stress: Not on file  Social Connections: Not on file     Family History: The patient's family history includes Asthma in her mother; Cancer in her sister; Diabetes in her father; Heart failure in her father and mother. ROS:   Please see the history of present  illness.    All other systems reviewed and are negative.  EKGs/Labs/Other Studies Reviewed:    The following studies were reviewed today:  EKG:  EKG ordered today and personally reviewed.  The ekg ordered today demonstrates sinus bradycardia normal QT interval rate 56 bpm  Recent Labs: No results found for requested labs within last 8760 hours.  Recent Lipid Panel    Component Value Date/Time   CHOL 219 (H) 06/08/2019 1156   TRIG 194 (H) 06/08/2019 1156   HDL 45 06/08/2019 1156   CHOLHDL 4.9 (H) 06/08/2019 1156   CHOLHDL 3.1 06/01/2016 0900   VLDL 21  06/01/2016 0900   LDLCALC 139 (H) 06/08/2019 1156    Physical Exam:    VS:  BP 128/74 (BP Location: Right Arm, Patient Position: Sitting)   Pulse (!) 54   Ht 5\' 4"  (1.626 m)   Wt 126 lb 12.8 oz (57.5 kg)   SpO2 99%   BMI 21.77 kg/m     Wt Readings from Last 3 Encounters:  11/12/20 126 lb 12.8 oz (57.5 kg)  02/19/20 139 lb 9.6 oz (63.3 kg)  06/08/19 144 lb 12.8 oz (65.7 kg)     GEN: She looks very frail chronically ill  in no acute distress HEENT: Normal NECK: No JVD; No carotid bruits LYMPHATICS: No lymphadenopathy CARDIAC: RRR, no murmurs, rubs, gallops RESPIRATORY:  Clear to auscultation without rales, wheezing or rhonchi  ABDOMEN: Soft, non-tender, non-distended MUSCULOSKELETAL:  No edema; No deformity  SKIN: Warm and dry NEUROLOGIC:  Alert and oriented x 3 PSYCHIATRIC:  Normal affect    Signed, 06/10/19, MD  11/12/2020 4:02 PM    [facility]

## 2020-12-15 ENCOUNTER — Telehealth: Payer: Self-pay | Admitting: Cardiology

## 2020-12-15 DIAGNOSIS — R079 Chest pain, unspecified: Secondary | ICD-10-CM | POA: Diagnosis not present

## 2020-12-15 NOTE — Telephone Encounter (Signed)
I called and spoke w/dee and stated yes she's a pt here but we don't manage warfarin

## 2020-12-15 NOTE — Telephone Encounter (Signed)
Pt c/o medication issue:  1. Name of Medication:   sotalol (BETAPACE) 80 MG tablet Take one tablet by mouth three times daily alternating with twice daily every other day   warfarin (COUMADIN) 2 MG tablet    warfarin (COUMADIN) 2 MG tablet Take 2 mg by mouth as directed. Currently alternating 2mg /2.5mg    warfarin (COUMADIN) 2.5 MG tablet    2. How are you currently taking this medication (dosage and times per day)?unknown   3. Are you having a reaction (difficulty breathing--STAT)? No   4. What is your medication issue? is calling in from [place] [facility] to get get clarifications on the pt's medication instructions

## 2020-12-16 ENCOUNTER — Other Ambulatory Visit: Payer: Self-pay | Admitting: Cardiology

## 2020-12-16 DIAGNOSIS — R079 Chest pain, unspecified: Secondary | ICD-10-CM | POA: Diagnosis not present

## 2021-01-14 ENCOUNTER — Other Ambulatory Visit: Payer: Self-pay | Admitting: Cardiology

## 2021-01-15 NOTE — Telephone Encounter (Signed)
Refill sent to pharmacy.   

## 2021-07-28 ENCOUNTER — Ambulatory Visit: Payer: Medicare Other | Admitting: Cardiology

## 2021-08-19 ENCOUNTER — Ambulatory Visit: Payer: Medicare Other | Admitting: Cardiology

## 2021-08-26 ENCOUNTER — Other Ambulatory Visit: Payer: Self-pay | Admitting: Cardiology

## 2021-11-07 NOTE — Progress Notes (Signed)
?Cardiology Office Note:   ? ?Date:  11/09/2021  ? ?ID:  Julie Kemp, DOB May 07, 1941, MRN [id number] ? ?PCP:  Myrlene Broker, MD  ?Cardiologist:  Shirlee More, MD   ? ?Referring MD: Myrlene Broker, MD  ? ? ?ASSESSMENT:   ? ?1. PAF (paroxysmal atrial fibrillation) ([place])   ?2. High risk medication use   ?3. Chronic anticoagulation   ?4. Essential hypertension   ? ?PLAN:   ? ?In order of problems listed above: ? ?She continues to do well with supplemental well-tolerated despite a background history of asthma no recurrence of atrial fibrillation prior to leaving the office we will do an EKG to assess her QT interval and she will continue her low-dose sotalol 80 mg twice daily along with warfarin managed by her PCP office goal INR 2.5 ?Blood pressure at target continue sotalol beta-blocker ?Continue nonstatin Zetia and fenofibrate for hyperlipidemia labs follow-up in the PCP office ? ? ?Next appointment: 1 year ? ? ?Medication Adjustments/Labs and Tests Ordered: ?Current medicines are reviewed at length with the patient today.  Concerns regarding medicines are outlined above.  ?Orders Placed This Encounter  ?Procedures  ? EKG 12-Lead  ? ?No orders of the defined types were placed in this encounter. ? ? ?No chief complaint on file. ? ? ?History of Present Illness:   ? ?Julie Kemp is a 81 y.o. female with a hx of  paroxysmal atrial fibrillation on sotalol and anticoagulated with Coumadin obstructive sleep apnea on CPAP and asthma  last seen 11/12/2020.  Echocardiogram November 2020 showed normal ejection fraction mildly reduced right ventricular function normal size left atrium is mildly enlarged there is mild to moderate mitral regurgitation present. ? ?Compliance with diet, lifestyle and medications: Yes ? ?Recent INR 2.50 ?CBC 11/26/2020 hemoglobin 11.9 ? ?Her daughter-in-law is with her she supervises her medical care and medications. ?She has had no bleeding from her warfarin ?They are careful with  over-the-counter products. ?She is not having shortness of breath edema orthopnea chest pain palpitation or syncope. ?She has had no flare of asthma. ?Overall she is happy with the quality of her life ?Past Medical History:  ?Diagnosis Date  ? Asthma   ? Atrial fibrillation ([place])   ? on coumadin/  ? CAD (coronary artery disease) 12/21/12  ? This is a normal carotid duplex Doppler evaluation  ? Coronary artery disease 03/08/2013  ? The cavity size was normal . Wall thickness was increased in a pattern of mild LVH . Systolic function was normal . The estimated ejection fraction of 55%to60% . Wall motion was normal . There were no regional  wall motion abnormalities.   ? Eczema   ? Heart murmur   ? office visit 03/01/2013  ? Heart murmur 10/11/2001  ? Normal myocardial perfushion study.No significant ischemia demonstrated. This is a low risk scan  ? Unsteady gait   ? ? ?Past Surgical History:  ?Procedure Laterality Date  ? ABDOMINAL HYSTERECTOMY    ? Left femoral hernia repair    ? lymph nodes    ? left groin lymph node biopsies for benign reasons  ? RECTOCELE REPAIR    ? ? ?Current Medications: ?No outpatient medications have been marked as taking for the 11/09/21 encounter (Office Visit) with Richardo Priest, MD.  ?  ? ?Allergies:   Zithromax [azithromycin], Amoxicillin, and Sulfa antibiotics  ? ?Social History  ? ?Socioeconomic History  ? Marital status: Married  ?  Spouse name: Not on file  ?  Number of children: 3  ? Years of education: 40 th  ? Highest education level: Not on file  ?Occupational History  ?  Comment: retired  ?Tobacco Use  ? Smoking status: Never  ?  Passive exposure: Never  ? Smokeless tobacco: Never  ?Vaping Use  ? Vaping Use: Never used  ?Substance and Sexual Activity  ? Alcohol use: No  ?  Alcohol/week: 0.0 standard drinks  ? Drug use: No  ? Sexual activity: Not on file  ?Other Topics Concern  ? Not on file  ?Social History Narrative  ? Patient lives at home alone. Patient is widowed.  ? Retired.   ? Education 9th grade  ? Right handed.  ? Caffeine - None  ? ?Social Determinants of Health  ? ?Financial Resource Strain: Not on file  ?Food Insecurity: Not on file  ?Transportation Needs: Not on file  ?Physical Activity: Not on file  ?Stress: Not on file  ?Social Connections: Not on file  ?  ? ?Family History: ?The patient's family history includes Asthma in her mother; Cancer in her sister; Diabetes in her father; Heart failure in her father and mother. ?ROS:   ?Please see the history of present illness.    ?All other systems reviewed and are negative. ? ?EKGs/Labs/Other Studies Reviewed:   ? ?The following studies were reviewed today: ? ?EKG:  EKG ordered today and personally reviewed.  The ekg ordered today demonstrates sinus bradycardia 56 bpm otherwise normal including QT interval ? ?Recent Labs: ?No results found for requested labs within last 8760 hours.  ?Recent Lipid Panel ?   ?Component Value Date/Time  ? CHOL 219 (H) 06/08/2019 1156  ? TRIG 194 (H) 06/08/2019 1156  ? HDL 45 06/08/2019 1156  ? CHOLHDL 4.9 (H) 06/08/2019 1156  ? CHOLHDL 3.1 06/01/2016 0900  ? VLDL 21 06/01/2016 0900  ? LDLCALC 139 (H) 06/08/2019 1156  ? ? ?Physical Exam:   ? ?VS:  BP 126/78 (BP Location: Left Arm)   Pulse 72   Ht 5\' 4"  (1.626 m)   Wt 143 lb (64.9 kg)   BMI 24.55 kg/m?    ? ?Wt Readings from Last 3 Encounters:  ?11/09/21 143 lb (64.9 kg)  ?11/12/20 126 lb 12.8 oz (57.5 kg)  ?02/19/20 139 lb 9.6 oz (63.3 kg)  ?  ? ?GEN:  Well nourished, well developed in no acute distress ?HEENT: Normal ?NECK: No JVD; No carotid bruits ?LYMPHATICS: No lymphadenopathy ?CARDIAC: RRR, no murmurs, rubs, gallops ?RESPIRATORY:  Clear to auscultation without rales, wheezing or rhonchi  ?ABDOMEN: Soft, non-tender, non-distended ?MUSCULOSKELETAL:  No edema; No deformity  ?SKIN: Warm and dry ?NEUROLOGIC:  Alert and oriented x 3 ?PSYCHIATRIC:  Normal affect  ? ? ?Signed, ?Shirlee More, MD  ?11/09/2021 8:36 AM    ?[place]  ?

## 2021-11-09 ENCOUNTER — Ambulatory Visit: Payer: Medicare Other | Admitting: Cardiology

## 2021-11-09 ENCOUNTER — Encounter: Payer: Self-pay | Admitting: Cardiology

## 2021-11-09 VITALS — BP 126/78 | HR 72 | Ht 64.0 in | Wt 143.0 lb

## 2021-11-09 DIAGNOSIS — Z79899 Other long term (current) drug therapy: Secondary | ICD-10-CM | POA: Diagnosis not present

## 2021-11-09 DIAGNOSIS — I480 Paroxysmal atrial fibrillation: Secondary | ICD-10-CM | POA: Diagnosis not present

## 2021-11-09 DIAGNOSIS — I10 Essential (primary) hypertension: Secondary | ICD-10-CM | POA: Diagnosis not present

## 2021-11-09 DIAGNOSIS — Z7901 Long term (current) use of anticoagulants: Secondary | ICD-10-CM | POA: Diagnosis not present

## 2021-11-09 NOTE — Patient Instructions (Signed)
Medication Instructions:  ?Your physician recommends that you continue on your current medications as directed. Please refer to the Current Medication list given to you today. ? ?*If you need a refill on your cardiac medications before your next appointment, please call your pharmacy* ? ? ?Lab Work: ?NONE ?If you have labs (blood work) drawn today and your tests are completely normal, you will receive your results only by: ?MyChart Message (if you have MyChart) OR ?A paper copy in the mail ?If you have any lab test that is abnormal or we need to change your treatment, we will call you to review the results. ? ? ?Testing/Procedures: ?NONE ? ? ?Follow-Up: ?At [facility], you and your health needs are our priority.  As part of our continuing mission to provide you with exceptional heart care, we have created designated Provider Care Teams.  These Care Teams include your primary Cardiologist (physician) and Advanced Practice Providers (APPs -  Physician Assistants and Nurse Practitioners) who all work together to provide you with the care you need, when you need it. ? ?We recommend signing up for the patient portal called "MyChart".  Sign up information is provided on this After Visit Summary.  MyChart is used to connect with patients for Virtual Visits (Telemedicine).  Patients are able to view lab/test results, encounter notes, upcoming appointments, etc.  Non-urgent messages can be sent to your provider as well.   ?To learn more about what you can do with MyChart, go to [email or web address].   ? ?Your next appointment:   ?1 year(s) ? ?The format for your next appointment:   ?In Person ? ?Provider:   ?Brian Munley, MD  ? ? ?Other Instructions ? ? ? ?Important Information About Sugar ? ? ? ? ?  ?

## 2021-12-18 ENCOUNTER — Other Ambulatory Visit: Payer: Self-pay | Admitting: Cardiology

## 2022-01-05 DIAGNOSIS — I340 Nonrheumatic mitral (valve) insufficiency: Secondary | ICD-10-CM

## 2022-01-08 ENCOUNTER — Telehealth: Payer: Self-pay | Admitting: Cardiology

## 2022-01-08 ENCOUNTER — Encounter: Payer: Self-pay | Admitting: Cardiology

## 2022-01-08 NOTE — Telephone Encounter (Signed)
Error

## 2022-01-08 NOTE — Telephone Encounter (Signed)
Gretchen with [facility] called to schedule hospital follow up. However, Dr. Hulen Shouts schedule for Julie Kemp is completely full at this time. Vinnie Langton would like to know if the patient can be worked in at the [facility]. Please advise.

## 2022-01-25 NOTE — Telephone Encounter (Signed)
Caller is following-up on scheduling this patient's hospital f/u visit.

## 2022-01-27 NOTE — Telephone Encounter (Signed)
The patient lives in a nursing home and the nursing home scheduled the patient to be seen in the [place] office. Patient's daughter-in-law had no further questions at this time.

## 2022-02-05 NOTE — Progress Notes (Unsigned)
Office Visit    Patient Name: Julie Kemp Date of Encounter: 02/09/2022  Primary Care Provider:  Myrlene Broker, MD Primary Cardiologist:  Shirlee More, MD Primary Electrophysiologist: None  Chief Complaint    Julie Kemp is a 81 y.o. female with PMH of paroxysmal atrial fibrillation (on warfarin), sick sinus syndrome, anemia, asthma, HTN, HLD, OSA on CPAP who presents today for hospital follow-up and management of atrial fibrillation.  Past Medical History    Past Medical History:  Diagnosis Date   Asthma    Atrial fibrillation ([place])    on coumadin/   CAD (coronary artery disease) 12/21/12   This is a normal carotid duplex Doppler evaluation   Coronary artery disease 03/08/2013   The cavity size was normal . Wall thickness was increased in a pattern of mild LVH . Systolic function was normal . The estimated ejection fraction of 55%to60% . Wall motion was normal . There were no regional  wall motion abnormalities.    Eczema    Heart murmur    office visit 03/01/2013   Heart murmur 10/11/2001   Normal myocardial perfushion study.No significant ischemia demonstrated. This is a low risk scan   Unsteady gait    Past Surgical History:  Procedure Laterality Date   ABDOMINAL HYSTERECTOMY     Left femoral hernia repair     lymph nodes     left groin lymph node biopsies for benign reasons   RECTOCELE REPAIR      Allergies  Allergies  Allergen Reactions   Zithromax [Azithromycin] Nausea And Vomiting   Amoxicillin Other (See Comments)    Can take Ceftin-per note   Sulfa Antibiotics Nausea Only    History of Present Illness    Julie Kemp is a 81 year old female with the above-mentioned past medical history who presents today for post hospital follow-up and atrial fibrillation.  She was originally followed by Dr. Rollene Fare and Dr. Claiborne Billings until 2020.  She is currently being seen by Dr. Bettina Gavia and was last seen in his office on 10/2021.  She has been on chronic  anticoagulation with Coumadin and has history of paroxysmal atrial fibrillation that is treated with sotalol therapy.  2D echo was performed 02/2013 with EF of 55-60%, moderate MR, mild to moderate biatrial enlargement.  She underwent Lexiscan stress test in 2015 due to near syncope, lightheadedness, fatigue and palpitations that was normal with no evidence of ischemia.  Carotid studies were also performed in 2014 that were essentially normal.    She was last seen by Dr. Bettina Gavia on 10/2021 for annual follow-up.  During appointment she was noted to be doing well and tolerating rhythm control on sotalol without any EKG changes.  Her blood pressure was noted to be at goal and she was tolerating Zetia and fenofibrate for hyperlipidemia.    She was admitted on 12/18/2021 to [facility] for acute metabolic encephalopathy related to UTI.  2D echo performed on 01/05/2022 at [facility] with an EF of 55-60% and moderate to severe mitral regurgitation and no RWMA.  Patient had a CTA of the chest that was unrevealing with no evidence of PE and her INR was subtherapeutic at 1.  She was bridged with Lovenox with plan to resume warfarin once INR is therapeutic between 2-3.  Patient endorsed chest pain however patient refused Lexiscan to rule out ischemia.  She presents today alone for posthospital follow-up.  She is a poor historian and is not able to answer any  questions regarding her health.  She has a history of dementia and has no family present for today's assessment.  She is pleasantly confused but is oriented x4.  She appears not to be in any distress and denies any chest pain currently.  She is euvolemic on examination and EKG today is sinus rhythm with normal QT interval.  She is unable to endorse any compliance with her medications.  When reviewing her nursing home medical record she is taking all medications as prescribed.  She is no longer on Lovenox bridging and is back on Coumadin 2.5 mg.  Home  Medications    Current Outpatient Medications  Medication Sig Dispense Refill   BLACK ELDERBERRY PO Take by mouth daily.     Calcium Carbonate-Vit D-Min (CALCIUM 1200 PO) Take 1 tablet by mouth 2 (two) times daily.     ezetimibe (ZETIA) 10 MG tablet Take 10 mg by mouth daily.     fenofibrate 160 MG tablet TAKE 1 TABLET BY MOUTH  DAILY 90 tablet 2   FERREX 150 150 MG capsule TAKE 1 CAPSULE BY MOUTH EVERY DAY 90 capsule 0   montelukast (SINGULAIR) 10 MG tablet TAKE ONE TABLET BY MOUTH ONCE DAILY AT BEDTIME 30 tablet 0   Multiple Vitamins-Minerals (MULTIVITAMIN WOMEN 50+ PO) Take by mouth daily.     Omega-3 Fatty Acids (FISH OIL PO) Take 1 capsule by mouth 3 (three) times daily. Taking Nature Bounty 1200 mg     pantoprazole (PROTONIX) 40 MG tablet TAKE 1 TABLET BY MOUTH  DAILY 90 tablet 3   sotalol (BETAPACE) 80 MG tablet Take one tablet by mouth three times daily alternating with twice daily every other day 270 tablet 2   warfarin (COUMADIN) 2 MG tablet Take 2 mg by mouth as directed. Currently alternating 2mg /2.5mg      warfarin (COUMADIN) 2.5 MG tablet Take 2.5 mg by mouth as directed. Currently alternating 2mg /2.5mg      No current facility-administered medications for this visit.     Review of Systems  Please see the history of present illness.    (+) Confused without disorientation (+)   All other systems reviewed and are otherwise negative except as noted above.  Physical Exam    Wt Readings from Last 3 Encounters:  02/09/22 133 lb 9.6 oz (60.6 kg)  11/09/21 143 lb (64.9 kg)  11/12/20 126 lb 12.8 oz (57.5 kg)   VS: Vitals:   02/09/22 0830  BP: 120/88  Pulse: 68  Resp: 16  ,Body mass index is 21.56 kg/m.  Constitutional:      Appearance: Healthy appearance. Not in distress.  Neck:     Vascular: JVD normal.  Pulmonary:     Effort: Pulmonary effort is normal.     Breath sounds: No wheezing. No rales. Diminished in the bases Cardiovascular:     Normal rate. Regular  rhythm. Normal S1. Normal S2.      Murmurs: There is no murmur.  Edema:    Peripheral edema absent.  Abdominal:     Palpations: Abdomen is soft non tender. There is no hepatomegaly.  Skin:    General: Skin is warm and dry.  Neurological:     General: No focal deficit present.     Mental Status: Alert and oriented to person, place and time.     Cranial Nerves: Cranial nerves are intact.  EKG/LABS/Other Studies Reviewed    ECG personally reviewed by me today -normal sinus rhythm with rate of 68 and QTc of 410 with  no acute findings  Risk Assessment/Calculations:    CHA2DS2-VASc Score = 4   This indicates a 4.8% annual risk of stroke. The patient's score is based upon: CHF History: 0 HTN History: 1 Diabetes History: 0 Stroke History: 0 Vascular Disease History: 0 Age Score: 2 Gender Score: 1     Lab Results  Component Value Date   WBC 5.0 02/04/2016   HGB 11.5 (L) 02/04/2016   HCT 35.5 (L) 02/04/2016   MCV 93.2 02/04/2016   PLT 267 02/04/2016   Lab Results  Component Value Date   CREATININE 1.13 (H) 06/12/2019   BUN 24 06/12/2019   NA 142 06/12/2019   K 4.5 06/12/2019   CL 101 06/12/2019   CO2 24 06/12/2019   Lab Results  Component Value Date   ALT 19 06/08/2019   AST 21 06/08/2019   ALKPHOS 51 06/08/2019   BILITOT 0.2 06/08/2019   Lab Results  Component Value Date   CHOL 219 (H) 06/08/2019   HDL 45 06/08/2019   LDLCALC 139 (H) 06/08/2019   TRIG 194 (H) 06/08/2019   CHOLHDL 4.9 (H) 06/08/2019    No results found for: "HGBA1C"  Assessment & Plan    1.  Paroxysmal atrial fibrillation/ Atypical Chest Pain -EKG today shows normal sinus rhythm with no evidence of QT prolongation -Continue sotalol 40 mg twice daily -Patient currently denies any bleeding with Coumadin -INR goal of 2.5 monitored by PCP continue dose per PCP  2.  Mitral regurgitation: -2D echo was completed following recent hospitalization in May with EF of 55-60% and moderate to severe  MR present -Patient's blood pressure is well controlled at 120/88.  She is euvolemic on examination with no lower extremity edema present  3.  Hyperlipidemia -Last LDL cholesterol was 139 above goal of less than 70 is currently monitored by PCP -Continue fenofibrate 160 mg daily and ezetimibe 10 mg daily  4.  Obstructive sleep apnea -Unable to assess if patient is compliant with CPAP due to mentation -It would be advised that patient has power of attorney or family were present for future doctors visits.      Disposition: Follow-up with Norman Herrlich, MD or APP in 2 months    Medication Adjustments/Labs and Tests Ordered: Current medicines are reviewed at length with the patient today.  Concerns regarding medicines are outlined above.   Signed, Napoleon Form, Leodis Rains, NP 02/09/2022, 11:05 AM [facility]

## 2022-02-09 ENCOUNTER — Encounter: Payer: Self-pay | Admitting: Nurse Practitioner

## 2022-02-09 ENCOUNTER — Ambulatory Visit (INDEPENDENT_AMBULATORY_CARE_PROVIDER_SITE_OTHER): Payer: Medicare Other | Admitting: Nurse Practitioner

## 2022-02-09 VITALS — BP 120/88 | HR 68 | Resp 16 | Ht 66.0 in | Wt 133.6 lb

## 2022-02-09 DIAGNOSIS — I2510 Atherosclerotic heart disease of native coronary artery without angina pectoris: Secondary | ICD-10-CM

## 2022-02-09 DIAGNOSIS — I480 Paroxysmal atrial fibrillation: Secondary | ICD-10-CM | POA: Diagnosis not present

## 2022-02-09 NOTE — Patient Instructions (Addendum)
Medication Instructions:  Your physician recommends that you continue on your current medications as directed. Please refer to the Current Medication list given to you today.  *If you need a refill on your cardiac medications before your next appointment, please call your pharmacy*   Follow-Up: At [facility], you and your health needs are our priority.  As part of our continuing mission to provide you with exceptional heart care, we have created designated Provider Care Teams.  These Care Teams include your primary Cardiologist (physician) and Advanced Practice Providers (APPs -  Physician Assistants and Nurse Practitioners) who all work together to provide you with the care you need, when you need it.  We recommend signing up for the patient portal called "MyChart".  Sign up information is provided on this After Visit Summary.  MyChart is used to connect with patients for Virtual Visits (Telemedicine).  Patients are able to view lab/test results, encounter notes, upcoming appointments, etc.  Non-urgent messages can be sent to your provider as well.   To learn more about what you can do with MyChart, go to [email or web address].    Your next appointment:   2 month(s)  The format for your next appointment:   In Person  Provider:   Norman Herrlich, MD    Other Instructions Please bring a Family member or representative with patient at next appointment.

## 2022-05-07 ENCOUNTER — Ambulatory Visit: Payer: Medicare Other | Admitting: Cardiology

## 2022-05-13 ENCOUNTER — Ambulatory Visit: Payer: Medicare Other | Admitting: Cardiology

## 2022-06-05 ENCOUNTER — Other Ambulatory Visit: Payer: Self-pay | Admitting: Cardiology

## 2022-06-22 ENCOUNTER — Other Ambulatory Visit: Payer: Self-pay | Admitting: Cardiology

## 2022-06-22 NOTE — Telephone Encounter (Signed)
Rx refill sent to pharmacy. 

## 2022-11-16 ENCOUNTER — Other Ambulatory Visit: Payer: Self-pay | Admitting: Cardiology

## 2022-11-16 NOTE — Telephone Encounter (Signed)
Rx # 90 only, patient needs appointment for future refill

## 2022-12-15 ENCOUNTER — Telehealth: Payer: Self-pay | Admitting: Cardiology

## 2022-12-15 NOTE — Telephone Encounter (Signed)
Daughter-in-law stated patient is in a nursing home and has been getting medications automatically delivered from [facility] and she wants to have these deliveries stopped.

## 2022-12-15 NOTE — Telephone Encounter (Signed)
Called the patient's daughter, Julie Kemp and informed her that she should contact [facility] to cancel the prescriptions being delivered to her house.Julie Kemp was agreeable with that plan and had no further questions at this time.

## 2023-01-21 ENCOUNTER — Other Ambulatory Visit: Payer: Self-pay | Admitting: Cardiology
